# Patient Record
Sex: Female | Born: 2000 | Race: Black or African American | Hispanic: No | Marital: Single | Smoking: Former smoker
Health system: Southern US, Community
[De-identification: ages and names within clinical notes are randomized; demographics above are authoritative.]

## PROBLEM LIST (undated history)

## (undated) DIAGNOSIS — N390 Urinary tract infection, site not specified: Secondary | ICD-10-CM

## (undated) DIAGNOSIS — A749 Chlamydial infection, unspecified: Secondary | ICD-10-CM

## (undated) DIAGNOSIS — A599 Trichomoniasis, unspecified: Secondary | ICD-10-CM

## (undated) DIAGNOSIS — A549 Gonococcal infection, unspecified: Secondary | ICD-10-CM

## (undated) DIAGNOSIS — F32A Depression, unspecified: Secondary | ICD-10-CM

## (undated) DIAGNOSIS — R519 Headache, unspecified: Secondary | ICD-10-CM

## (undated) DIAGNOSIS — F419 Anxiety disorder, unspecified: Secondary | ICD-10-CM

## (undated) HISTORY — PX: HERNIA REPAIR: SHX51

---
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
## 2021-02-10 IMAGING — CT CT HEAD W/O CM
3 series · 14 of 47 positions shown, 16 images · non-contrast
Comparison: None.

CLINICAL DATA: Sudden onset of headaches with right-sided facial
numbness and visual loss in the right eye.

EXAM:
CT HEAD WITHOUT CONTRAST
TECHNIQUE: Contiguous axial images were obtained from the base of the skull
through the vertex without intravenous contrast.

[Series 3: head 5.0 h30s · axial · 0.43mm/px · z∈[-129,-4]mm · 8 of 30 slices shown, 10 images]
[im 3/30  brain]
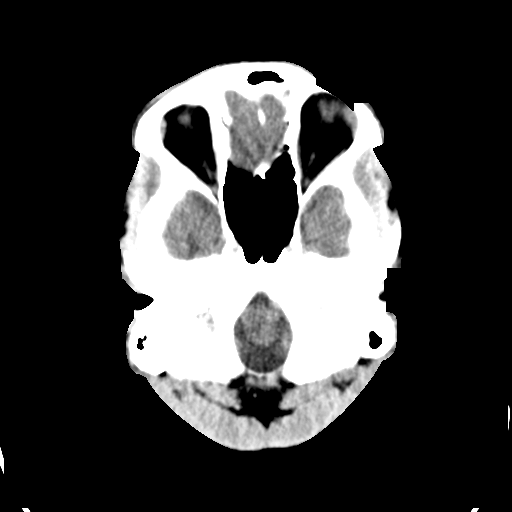
[im 3/30  bone]
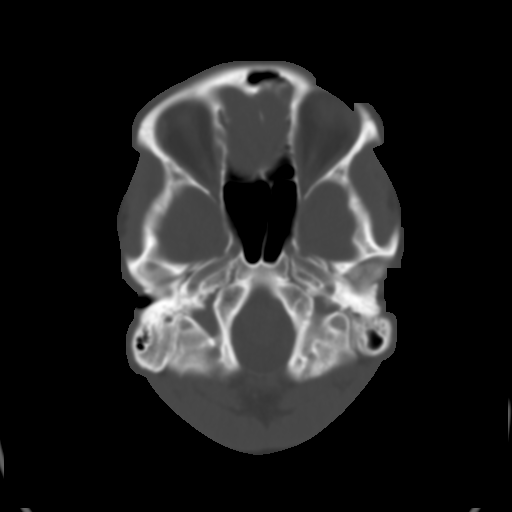
[im 7/30  brain]
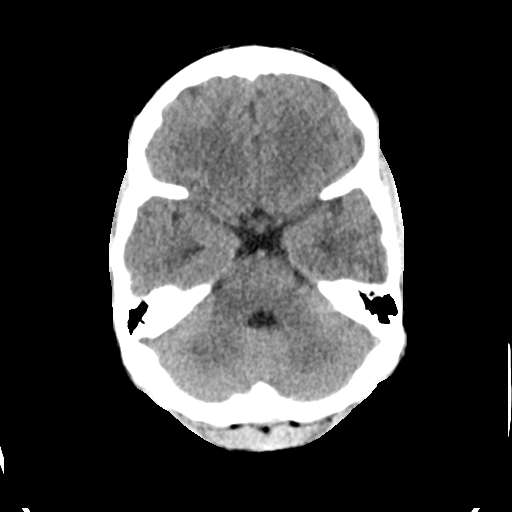
[im 10/30  brain]
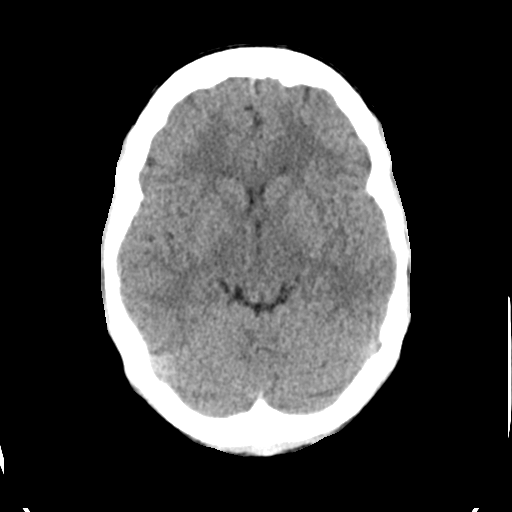
[im 14/30  brain]
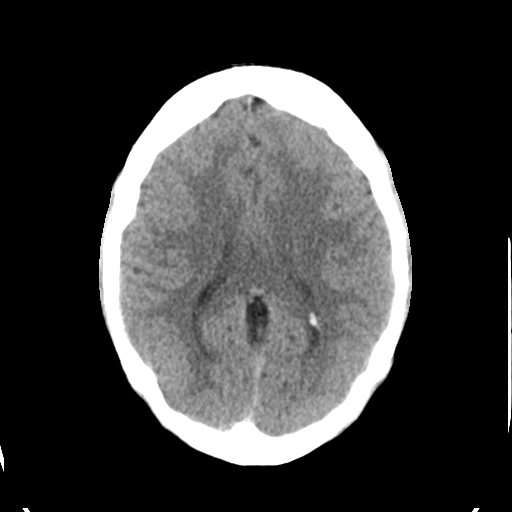
[im 17/30  brain]
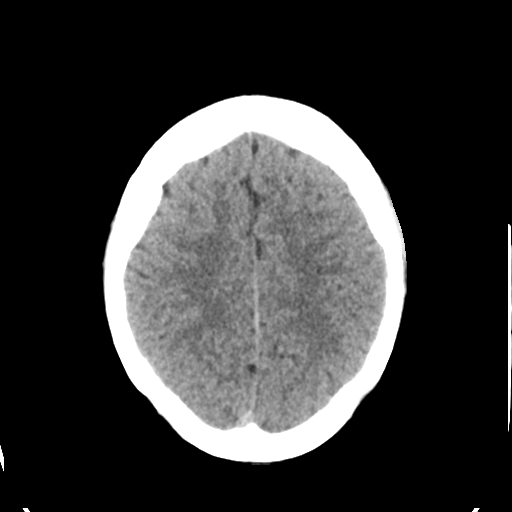
[im 17/30  bone]
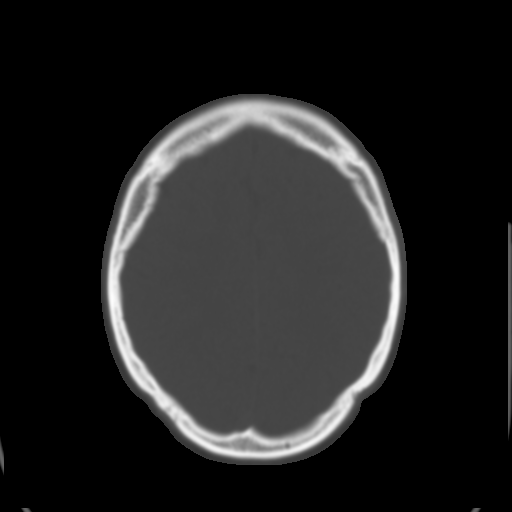
[im 21/30  brain]
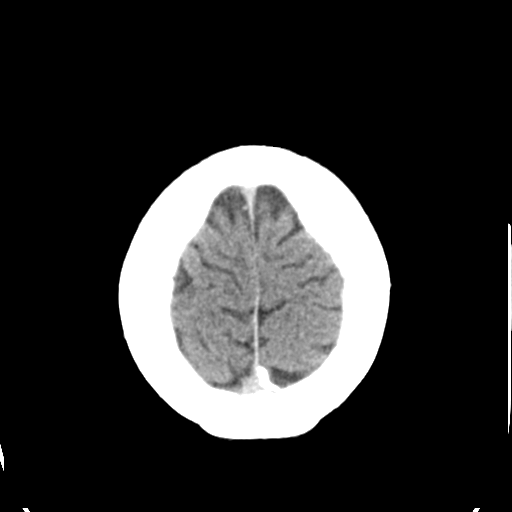
[im 24/30  brain]
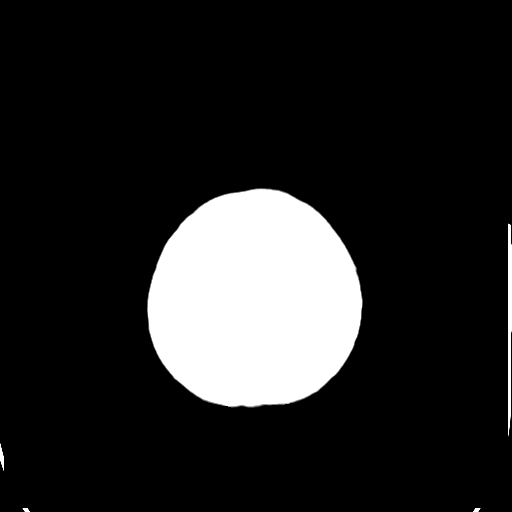
[im 28/30  brain]
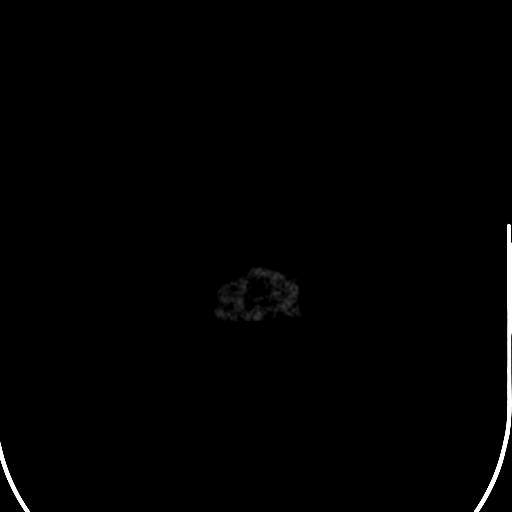

[Series 5: head 3.0 mpr cor · coronal · 0.28mm/px · 3 of 75 slices shown]
[im 25/75  brain]
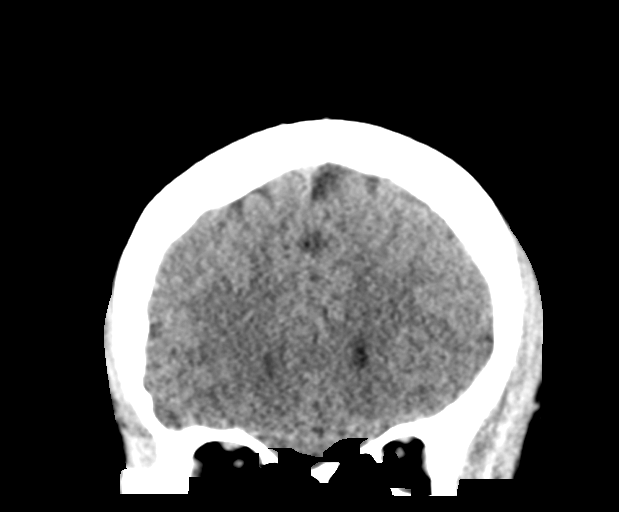
[im 33/75  brain]
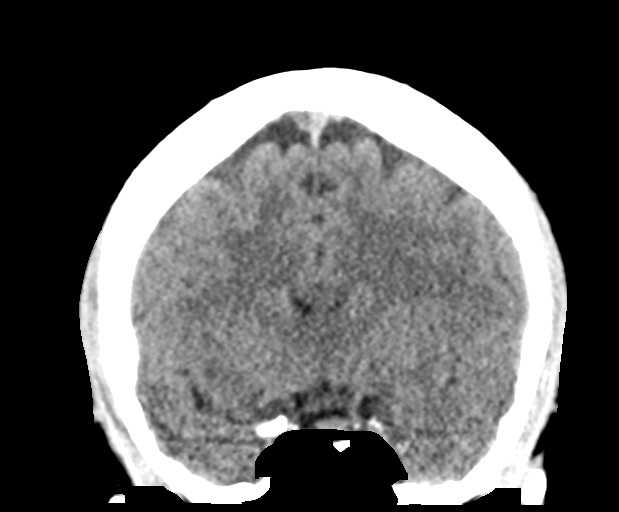
[im 42/75  brain]
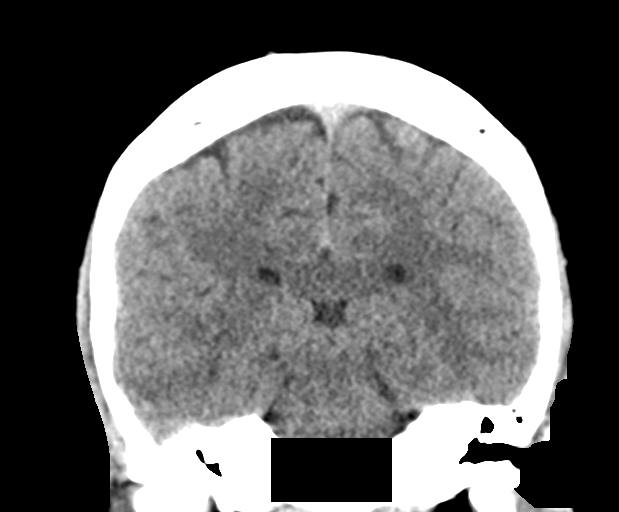

[Series 6: head 3.0 mpr sag · sagittal · 0.29mm/px · 3 of 67 slices shown]
[im 23/67  brain]
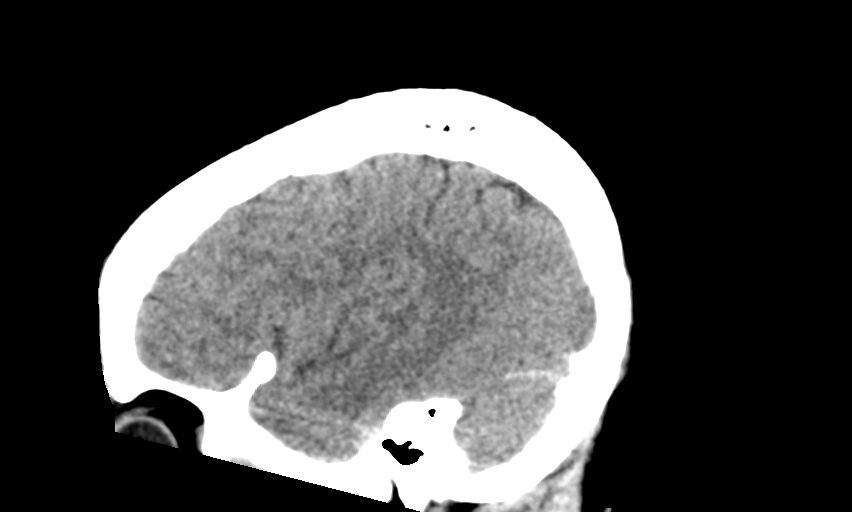
[im 34/67  brain]
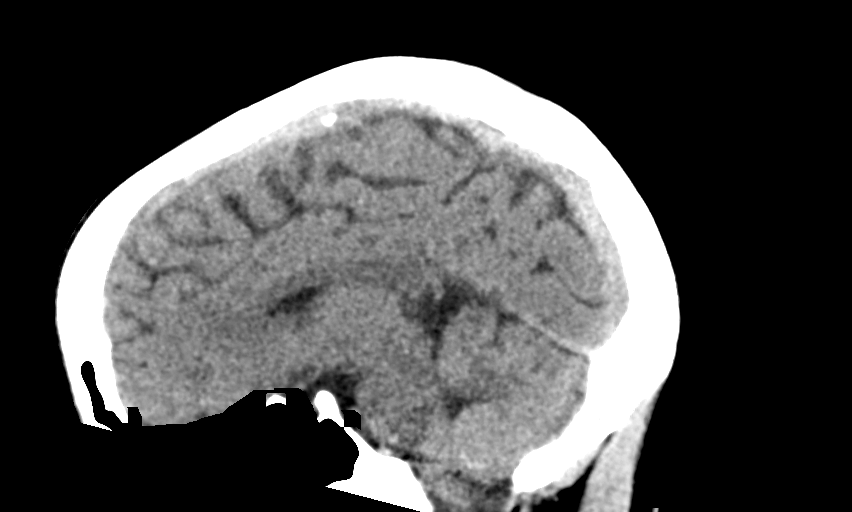
[im 45/67  brain]
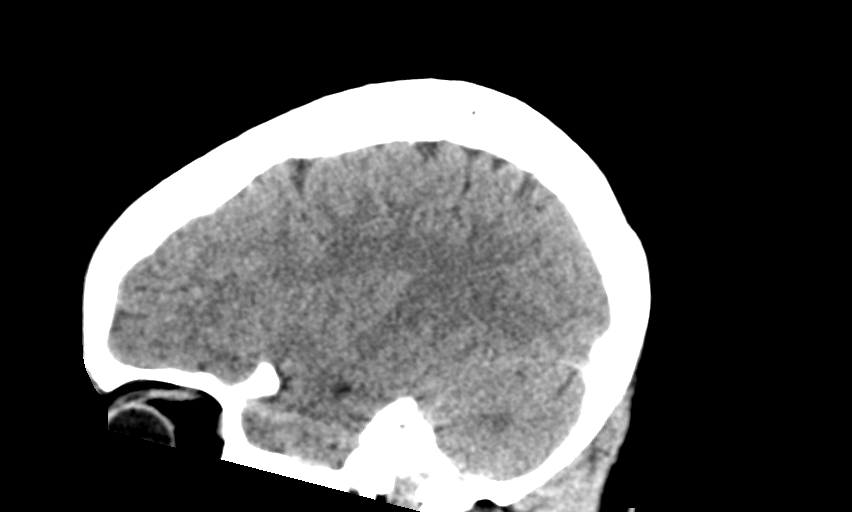

[14 of 47 positions shown; findings below may reference images not displayed]

FINDINGS: Brain: There is no evidence of acute intracranial hemorrhage, mass
lesion, brain edema or extra-axial fluid collection. The ventricles
and subarachnoid spaces are appropriately sized for age. There is no
CT evidence of acute cortical infarction.

Vascular:  No hyperdense vessel identified.

Skull: Negative for fracture or focal lesion.

Sinuses/Orbits: The visualized paranasal sinuses and mastoid air
cells are clear. No orbital abnormalities are seen.

Other: Incomplete posterior arch C1, normal variant.
IMPRESSION: Negative noncontrast head CT.

## 2021-03-19 IMAGING — DX DG ELBOW COMPLETE 3+V*L*
3 series · 3 of 3 positions shown · non-contrast
Comparison: None.

CLINICAL DATA: Status post fall.

EXAM:
LEFT ELBOW - COMPLETE 3+ VIEW

[elbow ap]
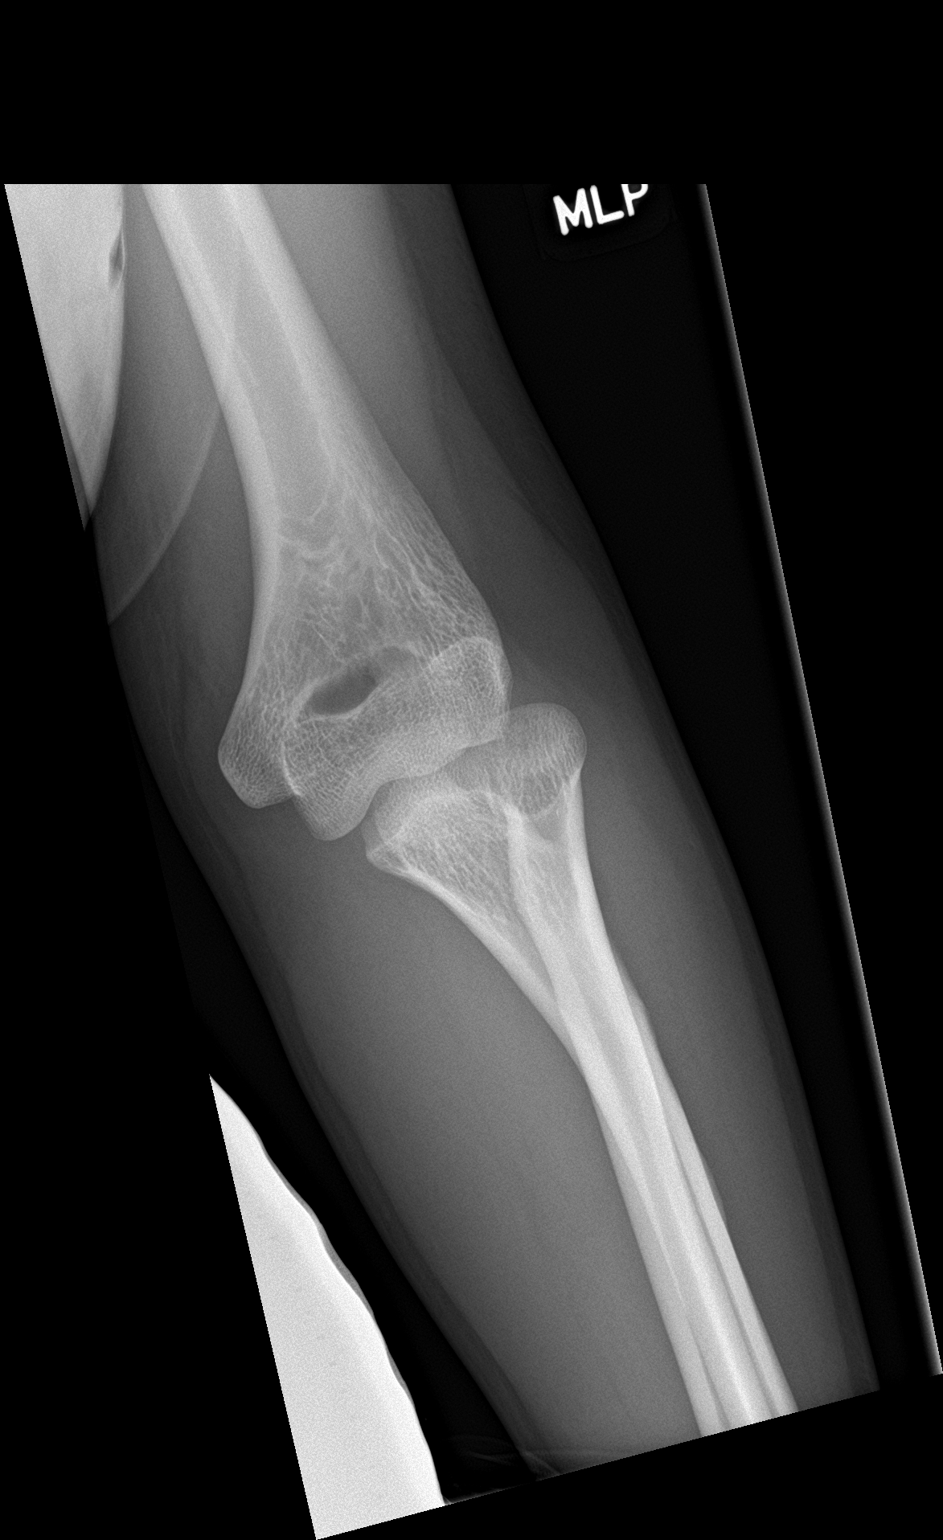

[elbow lat]
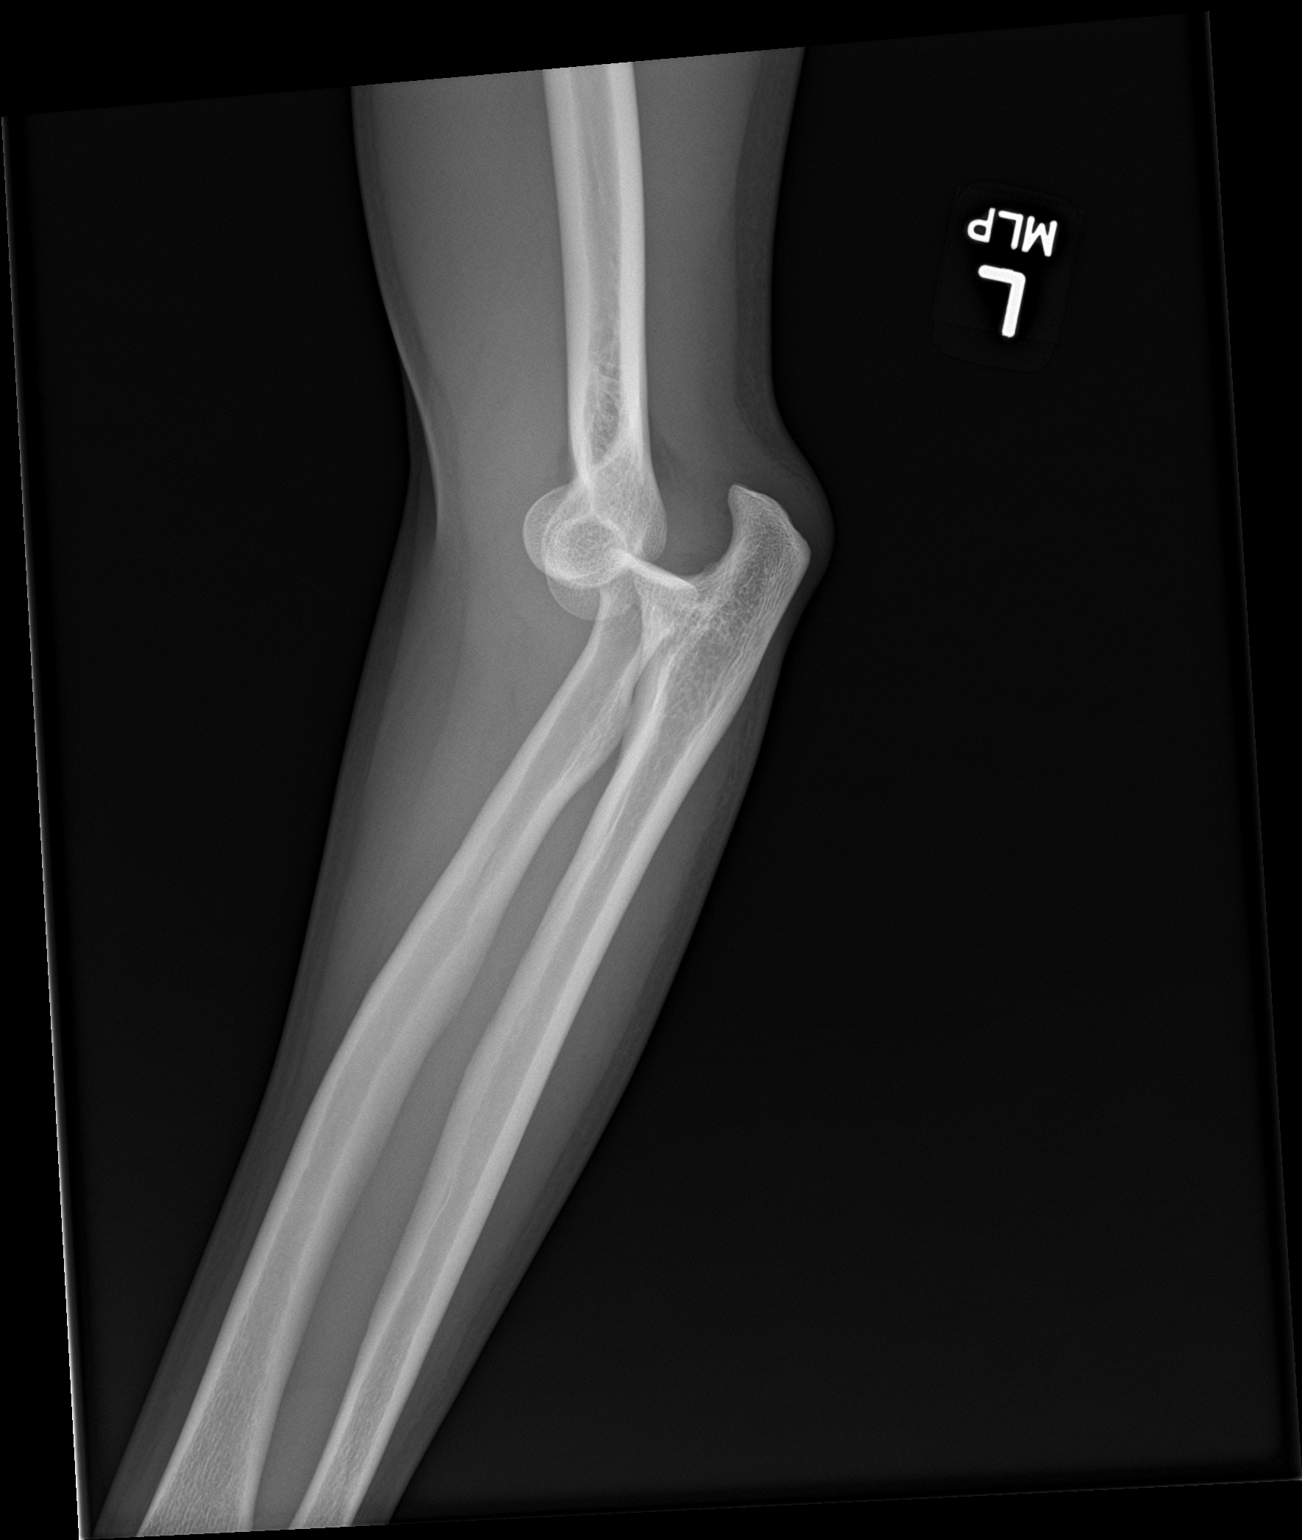

[elbow obl]
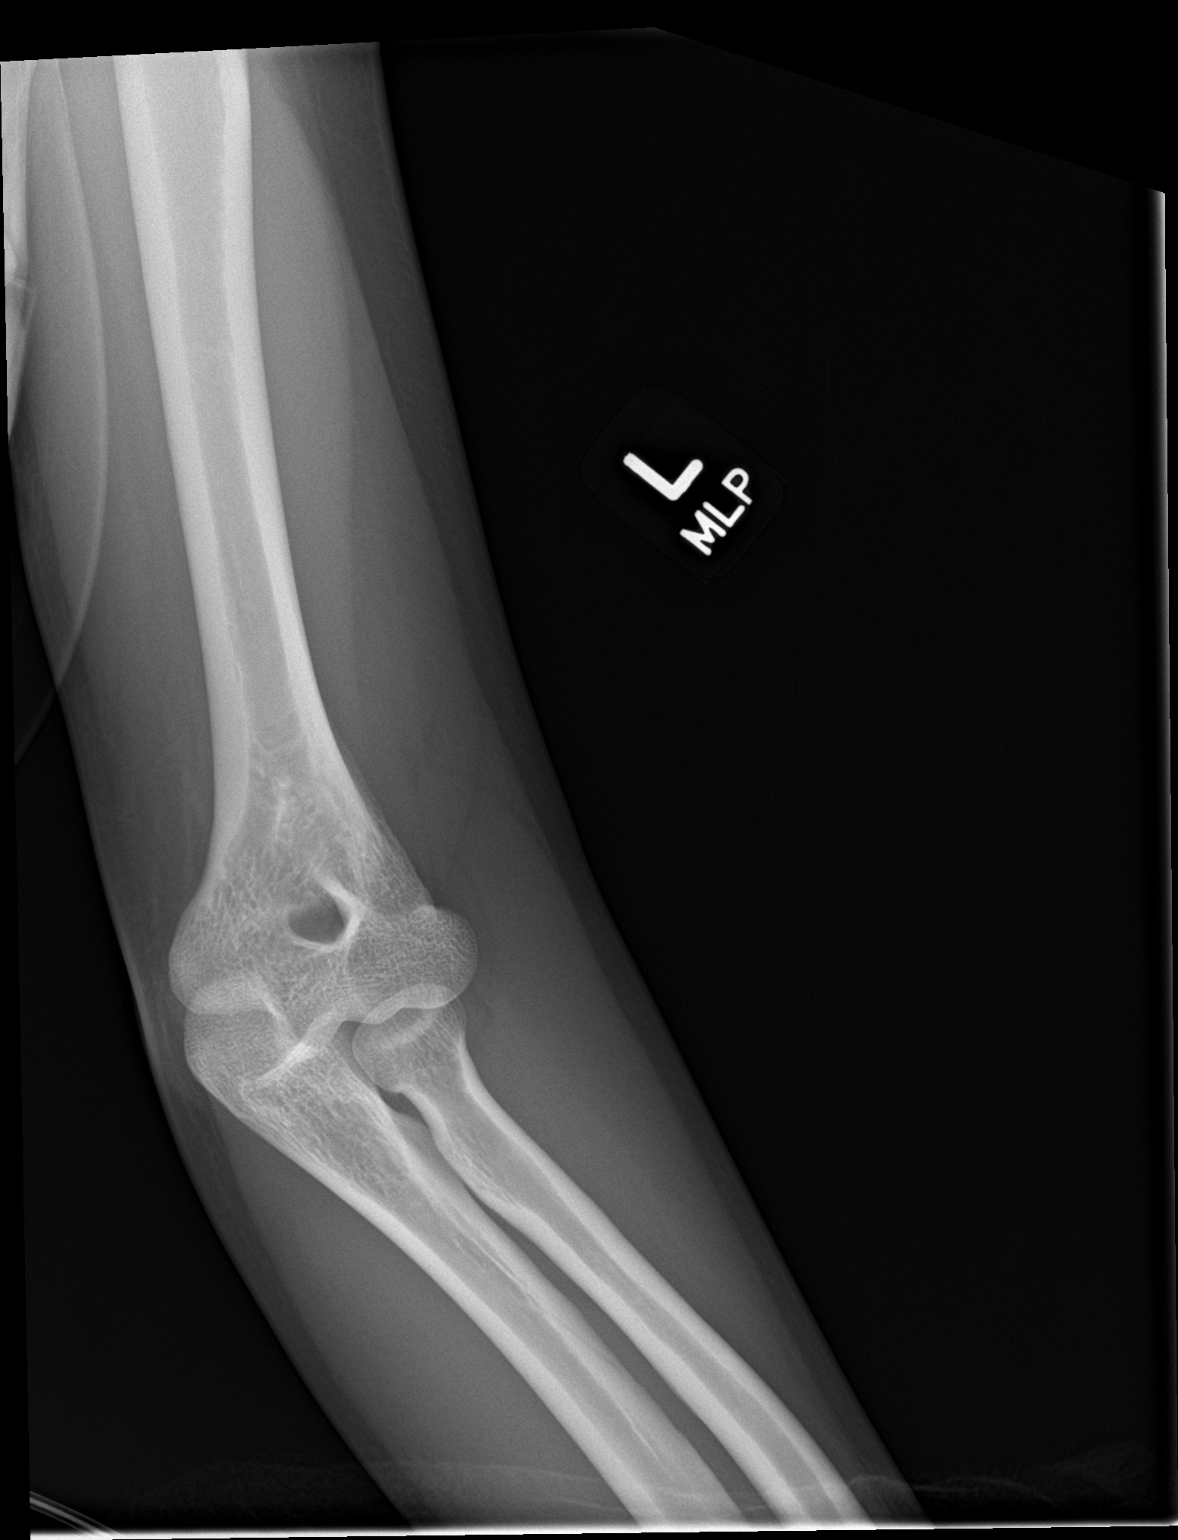

[3 of 3 positions shown; findings below may reference images not displayed]

FINDINGS: There is no evidence of an acute fracture. Posterior dislocation of
the left elbow is noted. There is no evidence of arthropathy or
other focal bone abnormality. Soft tissues are unremarkable.
IMPRESSION: Posterior left elbow dislocation.

## 2021-03-26 LAB — OB RESULTS CONSOLE RPR: RPR: NONREACTIVE

## 2021-03-26 LAB — OB RESULTS CONSOLE HEPATITIS B SURFACE ANTIGEN: Hepatitis B Surface Ag: NEGATIVE

## 2021-03-27 DIAGNOSIS — F1290 Cannabis use, unspecified, uncomplicated: Secondary | ICD-10-CM | POA: Insufficient documentation

## 2021-04-11 LAB — OB RESULTS CONSOLE GC/CHLAMYDIA
Chlamydia: NEGATIVE
Gonorrhea: NEGATIVE

## 2021-04-17 DIAGNOSIS — B9689 Other specified bacterial agents as the cause of diseases classified elsewhere: Secondary | ICD-10-CM | POA: Insufficient documentation

## 2021-04-17 DIAGNOSIS — Z8759 Personal history of other complications of pregnancy, childbirth and the puerperium: Secondary | ICD-10-CM | POA: Insufficient documentation

## 2021-04-17 DIAGNOSIS — L732 Hidradenitis suppurativa: Secondary | ICD-10-CM | POA: Insufficient documentation

## 2021-04-17 DIAGNOSIS — Z8659 Personal history of other mental and behavioral disorders: Secondary | ICD-10-CM | POA: Insufficient documentation

## 2021-04-17 DIAGNOSIS — Z8619 Personal history of other infectious and parasitic diseases: Secondary | ICD-10-CM | POA: Insufficient documentation

## 2021-06-20 DIAGNOSIS — O283 Abnormal ultrasonic finding on antenatal screening of mother: Secondary | ICD-10-CM | POA: Insufficient documentation

## 2021-06-21 DIAGNOSIS — A599 Trichomoniasis, unspecified: Secondary | ICD-10-CM | POA: Insufficient documentation

## 2021-07-25 ENCOUNTER — Telehealth: Payer: Self-pay | Admitting: Family Medicine

## 2021-07-25 ENCOUNTER — Telehealth: Payer: Self-pay | Admitting: General Practice

## 2021-07-25 NOTE — Telephone Encounter (Signed)
Patient called and left message on nurse voicemail line stating she is currently [redacted]w[redacted]d pregnant and her last doctor's visit was 3 weeks ago. Patient states she recently moved back to the area and wants to set up a New Ob appt to transfer her care to [place].

## 2021-07-25 NOTE — Telephone Encounter (Signed)
Patient was called at number listed under mobile number and there was no answer to the call. A voicemail was left with the office phone number and details to call back.

## 2021-07-31 ENCOUNTER — Other Ambulatory Visit: Payer: Self-pay

## 2021-07-31 ENCOUNTER — Encounter (HOSPITAL_COMMUNITY): Payer: Self-pay | Admitting: Emergency Medicine

## 2021-07-31 ENCOUNTER — Inpatient Hospital Stay (HOSPITAL_COMMUNITY)
Admission: AD | Admit: 2021-07-31 | Discharge: 2021-07-31 | Disposition: A | Discharge status: Home / Self Care | Payer: Medicaid Other | Attending: Obstetrics and Gynecology | Admitting: Obstetrics and Gynecology

## 2021-07-31 DIAGNOSIS — J101 Influenza due to other identified influenza virus with other respiratory manifestations: Secondary | ICD-10-CM | POA: Insufficient documentation

## 2021-07-31 DIAGNOSIS — Z20822 Contact with and (suspected) exposure to covid-19: Secondary | ICD-10-CM | POA: Insufficient documentation

## 2021-07-31 DIAGNOSIS — Z3A27 27 weeks gestation of pregnancy: Secondary | ICD-10-CM | POA: Insufficient documentation

## 2021-07-31 DIAGNOSIS — O99512 Diseases of the respiratory system complicating pregnancy, second trimester: Secondary | ICD-10-CM | POA: Insufficient documentation

## 2021-07-31 DIAGNOSIS — B349 Viral infection, unspecified: Secondary | ICD-10-CM | POA: Diagnosis present

## 2021-07-31 HISTORY — DX: Gonococcal infection, unspecified: A54.9

## 2021-07-31 HISTORY — DX: Urinary tract infection, site not specified: N39.0

## 2021-07-31 HISTORY — DX: Trichomoniasis, unspecified: A59.9

## 2021-07-31 HISTORY — DX: Headache, unspecified: R51.9

## 2021-07-31 HISTORY — DX: Chlamydial infection, unspecified: A74.9

## 2021-07-31 HISTORY — DX: Depression, unspecified: F32.A

## 2021-07-31 HISTORY — DX: Anxiety disorder, unspecified: F41.9

## 2021-07-31 LAB — URINALYSIS, ROUTINE W REFLEX MICROSCOPIC
Bilirubin Urine: NEGATIVE
Glucose, UA: NEGATIVE mg/dL
Hgb urine dipstick: NEGATIVE
Ketones, ur: 80 mg/dL — AB
Nitrite: NEGATIVE
Protein, ur: 100 mg/dL — AB
Specific Gravity, Urine: 1.03 (ref 1.005–1.030)
pH: 6 (ref 5.0–8.0)

## 2021-07-31 LAB — RESP PANEL BY RT-PCR (FLU A&B, COVID) ARPGX2
Influenza A by PCR: POSITIVE — AB
Influenza B by PCR: NEGATIVE
SARS Coronavirus 2 by RT PCR: NEGATIVE

## 2021-07-31 MED ORDER — CYCLOBENZAPRINE HCL 5 MG PO TABS
10.0000 mg | ORAL_TABLET | Freq: Once | ORAL | Status: AC
  Administered 2021-07-31: 10 mg via ORAL
  Filled 2021-07-31: qty 2

## 2021-07-31 MED ORDER — ACETAMINOPHEN 500 MG PO TABS
1000.0000 mg | ORAL_TABLET | Freq: Once | ORAL | Status: AC
  Administered 2021-07-31: 1000 mg via ORAL
  Filled 2021-07-31: qty 2

## 2021-07-31 MED ORDER — OSELTAMIVIR PHOSPHATE 75 MG PO CAPS: 75.0000 mg | ORAL_CAPSULE | Freq: Two times a day (BID) | ORAL | 0 refills | Status: AC

## 2021-07-31 MED ORDER — CYCLOBENZAPRINE HCL 5 MG PO TABS: 5.0000 mg | ORAL_TABLET | Freq: Three times a day (TID) | ORAL | 0 refills | Status: DC | PRN

## 2021-07-31 NOTE — ED Provider Notes (Signed)
Emergency Medicine Provider OB Triage Evaluation Note  Kerry Lucas is a 20 y.o. female, G2P1, at [redacted]w[redacted]d gestation who presents to the emergency department with complaints of headache onset today.  She has sick contacts of her daughter at home.  Patient was last seen by her OB in [place] in October 2022.  Since moving to the area she does not have OB follow-up yet.  Patient has associated fever, low back pain.  Patient has not tried medications for her symptoms.  Patient denies chest pain, shortness of breath, abdominal pain, increasing vaginal fluids, vaginal bleeding, rhinorrhea, nasal congestion.  Review of  Systems  Positive: Headache, fever Negative: Chest pain, abdominal pain  Physical Exam  BP (!) 117/106 (BP Location: Right Arm)   Pulse (!) 118   Temp (!) 100.7 F (38.2 C) (Oral)   Resp 13   Ht 5\' 1"  (1.549 m)   LMP 11/20/2020   SpO2 98%  General: Awake, no distress  HEENT: Atraumatic  Resp: Normal effort  Cardiac: Normal rate Abd: Nondistended, nontender  MSK: Moves all extremities without difficulty Neuro: Speech clear  Medical Decision Making  Pt evaluated for pregnancy concern and is stable for transfer to MAU. Pt is in agreement with plan for transfer.  10:44 AM Discussed with MAU APP, who accepts patient in transfer.  Will obtain COVID, flu swab prior to transfer, with MAU APP to follow-up results.  Clinical Impression   1. Viral illness        Leauna Sharber A, PA-C 07/31/21 1047    08/02/21, DO 07/31/21 1624

## 2021-07-31 NOTE — ED Notes (Signed)
Transporter called to take patient to MAU.

## 2021-07-31 NOTE — ED Triage Notes (Signed)
Patient c/o headache, eye pressure, fever and lower back pain. Patient is 27 weeks and 3 days pregnant- reports tightness in abdomen, similar to contractions. EDD 10/27/21 currently does not have an OBGYN.

## 2021-07-31 NOTE — Discharge Instructions (Signed)

## 2021-07-31 NOTE — MAU Note (Signed)
Pain in back started yesterday afternoon, became constant and excruciating last night. Cough started on Monday, has gotten progressively worse, coughing up clear mucous.

## 2021-07-31 NOTE — MAU Provider Note (Signed)
History     CSN: [id number]  Arrival date and time: 07/31/21 1013   Event Date/Time   First Provider Initiated Contact with Patient 07/31/21 1226      Chief Complaint  Patient presents with   Fever   Headache   HPI Kerry Lucas is a 20 y.o. G2P1001 at [redacted]w[redacted]d who presents with back pain, fever, headache, and cough. Symptoms started yesterday morning. Reports non productive cough. Temp has been up to 100.7 at home. Reports pain throughout her lower back that is worse with movement. Rates pain 10/10. Hasn't taken anything for her symptoms.  Denies sore throat, abdominal pain, dysuria, vaginal bleeding, or LOF.   OB History     Gravida  2   Para  1   Term  1   Preterm      AB      Living  1      SAB      IAB      Ectopic      Multiple      Live Births  1           Past Medical History:  Diagnosis Date   Anxiety    Chlamydia    Depression    Gonorrhea    Headache    Trichomonas infection    UTI (urinary tract infection)     Past Surgical History:  Procedure Laterality Date   HERNIA REPAIR Right 2017    Family History  Problem Relation Age of Onset   Healthy Mother    Hypertension Father    Diabetes Father     Social History   Tobacco Use   Smoking status: Former    Types: Cigarettes   Smokeless tobacco: Never   Tobacco comments:    Stopped cigarettes in June 2022  Vaping Use   Vaping Use: Former  Substance Use Topics   Alcohol use: Never   Drug use: Not Currently    Types: Marijuana    Allergies: No Known Allergies  No medications prior to admission.    Review of Systems  Constitutional: Negative.   HENT:  Negative for ear pain and sore throat.   Respiratory:  Positive for cough. Negative for shortness of breath and wheezing.   Cardiovascular:  Negative for chest pain.  Gastrointestinal: Negative.   Genitourinary: Negative.   Musculoskeletal:  Positive for back pain and myalgias.  Neurological:  Positive for  headaches.  Physical Exam   Blood pressure (!) 113/56, pulse (!) 113, temperature 98.3 F (36.8 C), temperature source Axillary, resp. rate 17, height 5\' 1"  (1.549 m), last menstrual period 11/20/2020, SpO2 100 %.  Physical Exam Vitals and nursing note reviewed.  Constitutional:      Appearance: She is ill-appearing.  HENT:     Head: Normocephalic and atraumatic.  Eyes:     General: No scleral icterus.    Pupils: Pupils are equal, round, and reactive to light.  Cardiovascular:     Rate and Rhythm: Regular rhythm. Tachycardia present.     Heart sounds: Normal heart sounds.  Pulmonary:     Effort: Pulmonary effort is normal. No respiratory distress.     Breath sounds: Normal breath sounds. No wheezing.  Skin:    General: Skin is warm and dry.  Neurological:     Mental Status: She is alert.  Psychiatric:        Mood and Affect: Mood normal.        Behavior: Behavior normal.    MAU  Course  Procedures Results for orders placed or performed during the hospital encounter of 07/31/21 (from the past 24 hour(s))  Resp Panel by RT-PCR (Flu A&B, Covid) Nasopharyngeal Swab     Status: Abnormal   Collection Time: 07/31/21 10:39 AM   Specimen: Nasopharyngeal Swab; Nasopharyngeal(NP) swabs in vial transport medium  Result Value Ref Range   SARS Coronavirus 2 by RT PCR NEGATIVE NEGATIVE   Influenza A by PCR POSITIVE (A) NEGATIVE   Influenza B by PCR NEGATIVE NEGATIVE  Urinalysis, Routine w reflex microscopic Urine, Clean Catch     Status: Abnormal   Collection Time: 07/31/21  1:09 PM  Result Value Ref Range   Color, Urine AMBER (A) YELLOW   APPearance CLOUDY (A) CLEAR   Specific Gravity, Urine 1.030 1.005 - 1.030   pH 6.0 5.0 - 8.0   Glucose, UA NEGATIVE NEGATIVE mg/dL   Hgb urine dipstick NEGATIVE NEGATIVE   Bilirubin Urine NEGATIVE NEGATIVE   Ketones, ur 80 (A) NEGATIVE mg/dL   Protein, ur 419 (A) NEGATIVE mg/dL   Nitrite NEGATIVE NEGATIVE   Leukocytes,Ua LARGE (A) NEGATIVE    RBC / HPF 0-5 0 - 5 RBC/hpf   WBC, UA 21-50 0 - 5 WBC/hpf   Bacteria, UA MANY (A) NONE SEEN   Squamous Epithelial / LPF 21-50 0 - 5   Mucus PRESENT     MDM Patient has tested positive for flu. Her symptoms & VS improved with meds in MAU. Will rx tamiflu.   Assessment and Plan   1. Influenza A   2. [redacted] weeks gestation of pregnancy    -Rx tamiflu -increase fluid intake -tylenol prn & given list of OTC meds safe in pregnancy -reviewed reasons to return to MAU  Judeth Horn 07/31/2021, 3:35 PM

## 2021-07-31 NOTE — MAU Note (Signed)
Sent from [facility]. Symptoms started yesterday. C/o back pain, cramping. Cough and headache and temp 100.7. Feeling baby move but not as mucha s normal. Denies any vag bleeding or discharge. Just moved from [facility]. LOoking for OB.

## 2021-08-19 ENCOUNTER — Encounter: Payer: Medicaid Other | Admitting: Obstetrics and Gynecology

## 2021-08-26 LAB — OB RESULTS CONSOLE HGB/HCT, BLOOD
HCT: 37 (ref 29–41)
Hemoglobin: 11.8

## 2021-08-26 LAB — OB RESULTS CONSOLE ABO/RH: RH Type: POSITIVE

## 2021-08-26 LAB — OB RESULTS CONSOLE RUBELLA ANTIBODY, IGM: Rubella: IMMUNE

## 2021-08-26 LAB — HEPATITIS B SURFACE ANTIGEN: Hepatitis B Surface Antigen: NONREACTIVE

## 2021-08-26 LAB — OB RESULTS CONSOLE GC/CHLAMYDIA
Chlamydia: NEGATIVE
Gonorrhea: NEGATIVE

## 2021-08-26 LAB — OB RESULTS CONSOLE RPR: RPR: NONREACTIVE

## 2021-08-26 LAB — GLUCOSE TOLERANCE, 1 HOUR: Glucose, 1 hour: 115

## 2021-08-26 LAB — OB RESULTS CONSOLE HIV ANTIBODY (ROUTINE TESTING): HIV: NONREACTIVE

## 2021-08-26 LAB — OB RESULTS CONSOLE ANTIBODY SCREEN: Antibody Screen: NEGATIVE

## 2021-08-26 LAB — OB RESULTS CONSOLE PLATELET COUNT: Platelets: 206

## 2021-09-08 NOTE — L&D Delivery Note (Signed)
OB/GYN Faculty Practice Delivery Note  Kerry Lucas is a 21 y.o. G2P1001 s/p SVD at [redacted]w[redacted]d. She was admitted for SROM.   ROM: 11h 69m with clear fluid GBS Status:  Negative/-- (01/24 1542) Maximum Maternal Temperature: 97.28F  Labor Progress: Initial SVE: 6/90/-1. She then progressed to complete with expectant management and AROM of forebag.   Delivery Date/Time: 1/31 at 2109 Delivery: Called to room when patient was complete. Patient pushed once in hands/knees position with subsequent delivery of infant. Head delivered midline OA. No nuchal cord present. Shoulder and body delivered in usual fashion. Infant with good tone/cry, placed on mother's abdomen, dried and stimulated. Cord clamped x 2 after 1-minute delay, and cut by FOB. Cord blood drawn. Placenta delivered spontaneously with gentle cord traction. Fundus firm with massage and Pitocin. Labia, perineum, vagina, and cervix inspected with no lacerations.   While in mom's arms at approximate 1.5 minutes of life, noted new fetal poor tone and respiratory effort. Infant was then taken to the warmer and evaluated by RN staff/NICU team. Resuscitated and then remained skin to skin with mother.   Baby Weight: pending  Placenta: 3 vessel, intact. Sent to L&D Complications: None Lacerations: None EBL: 350 mL Analgesia: None  Infant:  APGAR (1 MIN): 6   APGAR (5 MINS): 9   Leticia Penna, DO  OB Family Medicine Fellow, [facility] for [facility], [facility] 10/08/2021, 8:49 PM

## 2021-09-25 ENCOUNTER — Other Ambulatory Visit: Payer: Self-pay | Admitting: Family

## 2021-09-25 DIAGNOSIS — Z3A36 36 weeks gestation of pregnancy: Secondary | ICD-10-CM

## 2021-09-25 DIAGNOSIS — Z363 Encounter for antenatal screening for malformations: Secondary | ICD-10-CM

## 2021-09-25 DIAGNOSIS — D563 Thalassemia minor: Secondary | ICD-10-CM

## 2021-09-30 ENCOUNTER — Encounter (HOSPITAL_COMMUNITY): Payer: Self-pay | Admitting: Family Medicine

## 2021-09-30 ENCOUNTER — Encounter: Payer: Self-pay | Admitting: *Deleted

## 2021-09-30 ENCOUNTER — Inpatient Hospital Stay (HOSPITAL_COMMUNITY)
Admission: AD | Admit: 2021-09-30 | Discharge: 2021-09-30 | Disposition: A | Discharge status: Home / Self Care | Payer: Medicaid Other | Attending: Family Medicine | Admitting: Family Medicine

## 2021-09-30 ENCOUNTER — Other Ambulatory Visit: Payer: Self-pay

## 2021-09-30 DIAGNOSIS — Z3A36 36 weeks gestation of pregnancy: Secondary | ICD-10-CM | POA: Diagnosis not present

## 2021-09-30 DIAGNOSIS — O4703 False labor before 37 completed weeks of gestation, third trimester: Secondary | ICD-10-CM | POA: Diagnosis present

## 2021-09-30 DIAGNOSIS — O471 False labor at or after 37 completed weeks of gestation: Secondary | ICD-10-CM

## 2021-09-30 LAB — POCT FERN TEST: POCT Fern Test: NEGATIVE

## 2021-09-30 NOTE — MAU Provider Note (Signed)
S: [name] is a 21 y.o. G2P1001 at [redacted]w[redacted]d  who presents to MAU today for labor evaluation.     Cervical exam by RN:  Dilation: 1 Effacement (%): 80 Cervical Position: Posterior Station: 0 Exam by:: jolynn  Fetal Monitoring: Baseline: 140 Variability: moderate Accelerations: ++ Decelerations: none Contractions: initially q3, now spacing out with less pain  MDM Discussed patient with RN. NST reviewed.   A: SIUP at [redacted]w[redacted]d  False labor  P: Discharge home Labor precautions and kick counts included in AVS Patient to follow-up with OB provider as scheduled  Patient may return to MAU as needed or when in labor   Levie Heritage, DO 09/30/2021 12:22 PM

## 2021-09-30 NOTE — MAU Note (Addendum)
Had intercourse with fiance this morning.  Ever since then has been in excruciating pain.  Feels like contractions. one small gush after- none since. Noted some blood in toilet after voiding. Feeling lots of pressure in rectum

## 2021-10-01 ENCOUNTER — Other Ambulatory Visit (HOSPITAL_COMMUNITY)
Admission: RE | Admit: 2021-10-01 | Discharge: 2021-10-01 | Disposition: A | Discharge status: Home / Self Care | Payer: Medicaid Other | Source: Ambulatory Visit | Attending: Family Medicine | Admitting: Family Medicine

## 2021-10-01 ENCOUNTER — Ambulatory Visit: Payer: Medicaid Other

## 2021-10-01 ENCOUNTER — Encounter: Payer: Self-pay | Admitting: Family Medicine

## 2021-10-01 ENCOUNTER — Ambulatory Visit (INDEPENDENT_AMBULATORY_CARE_PROVIDER_SITE_OTHER): Payer: Medicaid Other | Admitting: Family Medicine

## 2021-10-01 VITALS — BP 112/71 | HR 93 | Wt 136.6 lb

## 2021-10-01 DIAGNOSIS — Z3480 Encounter for supervision of other normal pregnancy, unspecified trimester: Secondary | ICD-10-CM | POA: Diagnosis not present

## 2021-10-01 DIAGNOSIS — Z3493 Encounter for supervision of normal pregnancy, unspecified, third trimester: Secondary | ICD-10-CM | POA: Insufficient documentation

## 2021-10-01 DIAGNOSIS — Z3A36 36 weeks gestation of pregnancy: Secondary | ICD-10-CM

## 2021-10-01 NOTE — Progress Notes (Signed)
Subjective:   Kerry Lucas is a 21 y.o. G2P1001 at [redacted]w[redacted]d by early ultrasound being seen today for her first obstetrical visit.  She was previously seen at [place] and then transferred [facility]. Transferred from [place] after moving to [facility] from [place]. Transferring care to [facility] because of previous pregnancy being high risk (teen pregnancy at age 72) and because of low EFW of last baby.Her obstetrical history is significant for  1 prior pregnancy at 21 years old . Patient does intend to breast feed. Pregnancy history fully reviewed.  Patient reports no complaints.  HISTORY: OB History  Gravida Para Term Preterm AB Living  2 1 1  0 0 1  SAB IAB Ectopic Multiple Live Births  0 0 0 0 1    # Outcome Date GA Lbr Len/2nd Weight Sex Delivery Anes PTL Lv  2 Current           1 Term            Past Medical History:  Diagnosis Date   Anxiety    Chlamydia    Depression    denies issues   Gonorrhea    Headache    Trichomonas infection    UTI (urinary tract infection)    Past Surgical History:  Procedure Laterality Date   HERNIA REPAIR Right 2017   Family History  Problem Relation Age of Onset   Healthy Mother    Hypertension Father    Diabetes Father    Social History   Tobacco Use   Smoking status: Former    Types: Cigarettes   Smokeless tobacco: Never   Tobacco comments:    Stopped cigarettes in June 2022  Vaping Use   Vaping Use: Never used  Substance Use Topics   Alcohol use: Never   Drug use: Not Currently    Types: Marijuana   No Known Allergies Current Outpatient Medications on File Prior to Visit  Medication Sig Dispense Refill   cyclobenzaprine (FLEXERIL) 5 MG tablet Take 1 tablet (5 mg total) by mouth 3 (three) times daily as needed for muscle spasms. 20 tablet 0   ondansetron (ZOFRAN ODT) 4 MG disintegrating tablet Take 1 tablet (4 mg total) by mouth every 8 (eight) hours as needed for nausea or vomiting. 5 tablet 0   Prenatal Vit-Fe Fumarate-FA (PRENATAL  VITAMIN PO) Take by mouth.     No current facility-administered medications on file prior to visit.     Exam   Vitals:   10/01/21 1454  BP: 112/71  Pulse: 93  Weight: 136 lb 9.6 oz (62 kg)   Fetal Heart Rate (bpm): 141  Uterus:     Pelvic Exam: Perineum: no hemorrhoids, normal perineum   Vulva: normal external genitalia, no lesions   Vagina:  normal mucosa, normal discharge   Cervix: Exam deferred as examined at MAU yesterday (for labor check)  System: General: well-developed, well-nourished female in no acute distress   Extremities: normal strength, tone, and muscle mass, ROM of all joints is normal   Cardiovascular: regular rate   Respiratory:  no respiratory distress   Abdomen: soft, non-tender; bowel sounds normal; no masses,  no organomegaly, Gravid     Assessment:   Pregnancy: G2P1001 Patient Active Problem List   Diagnosis Date Noted   Fetal echogenic intracardiac focus on prenatal ultrasound 06/20/2021   History of anxiety 04/17/2021   History of vacuum extraction assisted delivery 04/17/2021   Acute cystitis without hematuria 06/14/2020   Abdominal pain, chronic, right lower  quadrant 06/11/2020   Routine screening for STI (sexually transmitted infection) 06/11/2020   Vaginal discharge 06/11/2020   Dysuria 06/11/2020     Plan:  1. Supervision of other normal pregnancy, antepartum 2. [redacted] weeks gestation of pregnancy Transferring care from [facility]. Overall normal pregnancy. Has growth [place] scheduled next week (had today but had to reschedule. Would like to discuss waterbirth. We discussed that she needs to take virtual class and sign consent after and with late transfer to care unsure if she will be able to. However will schedule next appt with CNM to discuss further. - 36 wk Swabs done today - follow up in 1 week - desires waterbirth - next appt with CNM  3. Echogenic intracardiac focus Seen on anatomy US at [place] (see on [place]). LR NIPS. Repeat US at  [facility] 12/19 with EFW 45%ile per provider note. Actual [place] report not seen in chart. - Follow up anatomy scheduled on 10/16/2021  3. Contraception Considering IUD or Depo.  Discussed PP IUD. Patient is considering.    Continue prenatal vitamins. Genetic Screening was done at previous OB office. LR NIPS[employer] SMA risk, and Alpha thal silent carrier Ultrasound discussed; fetal anatomic survey: results reviewed. Problem list reviewed and updated. The nature of [place] with multiple MDs and other Advanced Practice Providers was explained to patient; also emphasized that residents, students are part of our team. Routine obstetric precautions reviewed. Return in about 1 week (around 10/08/2021) for LROB, any provider.  Renard Matter, MD, MPH OB Fellow, Faculty Practice

## 2021-10-02 LAB — CERVICOVAGINAL ANCILLARY ONLY
Chlamydia: NEGATIVE
Comment: NEGATIVE
Comment: NORMAL
Neisseria Gonorrhea: NEGATIVE

## 2021-10-05 LAB — CULTURE, BETA STREP (GROUP B ONLY): Strep Gp B Culture: NEGATIVE

## 2021-10-06 ENCOUNTER — Encounter: Payer: Self-pay | Admitting: Family Medicine

## 2021-10-06 DIAGNOSIS — Z3480 Encounter for supervision of other normal pregnancy, unspecified trimester: Secondary | ICD-10-CM | POA: Insufficient documentation

## 2021-10-08 ENCOUNTER — Inpatient Hospital Stay (HOSPITAL_COMMUNITY)
Admission: AD | Admit: 2021-10-08 | Discharge: 2021-10-10 | DRG: 807 | Disposition: A | Discharge status: Home / Self Care | Payer: Medicaid Other | Attending: Obstetrics & Gynecology | Admitting: Obstetrics & Gynecology

## 2021-10-08 ENCOUNTER — Telehealth: Payer: Self-pay

## 2021-10-08 ENCOUNTER — Other Ambulatory Visit: Payer: Self-pay

## 2021-10-08 ENCOUNTER — Encounter (HOSPITAL_COMMUNITY): Payer: Self-pay | Admitting: Obstetrics and Gynecology

## 2021-10-08 ENCOUNTER — Telehealth: Payer: Self-pay | Admitting: Advanced Practice Midwife

## 2021-10-08 DIAGNOSIS — Z23 Encounter for immunization: Secondary | ICD-10-CM | POA: Diagnosis not present

## 2021-10-08 DIAGNOSIS — Z20822 Contact with and (suspected) exposure to covid-19: Secondary | ICD-10-CM | POA: Diagnosis present

## 2021-10-08 DIAGNOSIS — Z3A37 37 weeks gestation of pregnancy: Secondary | ICD-10-CM | POA: Diagnosis not present

## 2021-10-08 DIAGNOSIS — O4290 Premature rupture of membranes, unspecified as to length of time between rupture and onset of labor, unspecified weeks of gestation: Principal | ICD-10-CM | POA: Diagnosis present

## 2021-10-08 DIAGNOSIS — Z87891 Personal history of nicotine dependence: Secondary | ICD-10-CM | POA: Diagnosis not present

## 2021-10-08 DIAGNOSIS — O26893 Other specified pregnancy related conditions, third trimester: Secondary | ICD-10-CM | POA: Diagnosis present

## 2021-10-08 DIAGNOSIS — O283 Abnormal ultrasonic finding on antenatal screening of mother: Secondary | ICD-10-CM | POA: Diagnosis present

## 2021-10-08 DIAGNOSIS — Z3480 Encounter for supervision of other normal pregnancy, unspecified trimester: Secondary | ICD-10-CM

## 2021-10-08 DIAGNOSIS — O4292 Full-term premature rupture of membranes, unspecified as to length of time between rupture and onset of labor: Secondary | ICD-10-CM | POA: Diagnosis not present

## 2021-10-08 LAB — CBC
HCT: 38.6 % (ref 36.0–46.0)
Hemoglobin: 12.5 g/dL (ref 12.0–15.0)
MCH: 26.6 pg (ref 26.0–34.0)
MCHC: 32.4 g/dL (ref 30.0–36.0)
MCV: 82.1 fL (ref 80.0–100.0)
Platelets: 213 10*3/uL (ref 150–400)
RBC: 4.7 MIL/uL (ref 3.87–5.11)
RDW: 14.5 % (ref 11.5–15.5)
WBC: 12.1 10*3/uL — ABNORMAL HIGH (ref 4.0–10.5)
nRBC: 0 % (ref 0.0–0.2)

## 2021-10-08 LAB — RESP PANEL BY RT-PCR (FLU A&B, COVID) ARPGX2
Influenza A by PCR: NEGATIVE
Influenza B by PCR: NEGATIVE
SARS Coronavirus 2 by RT PCR: NEGATIVE

## 2021-10-08 LAB — TYPE AND SCREEN
ABO/RH(D): B POS
Antibody Screen: NEGATIVE

## 2021-10-08 MED ORDER — LACTATED RINGERS IV SOLN: INTRAVENOUS | Status: DC

## 2021-10-08 MED ORDER — OXYTOCIN 10 UNIT/ML IJ SOLN: 10.0000 [IU] | Freq: Once | INTRAMUSCULAR | Status: DC | PRN

## 2021-10-08 MED ORDER — ACETAMINOPHEN 325 MG PO TABS
650.0000 mg | ORAL_TABLET | ORAL | Status: DC | PRN
  Administered 2021-10-08: 650 mg via ORAL
  Filled 2021-10-08: qty 2

## 2021-10-08 MED ORDER — SODIUM CHLORIDE 0.9% FLUSH
3.0000 mL | Freq: Two times a day (BID) | INTRAVENOUS | Status: DC
  Administered 2021-10-08 – 2021-10-09 (×2): 3 mL via INTRAVENOUS

## 2021-10-08 MED ORDER — ONDANSETRON HCL 4 MG PO TABS: 4.0000 mg | ORAL_TABLET | ORAL | Status: DC | PRN

## 2021-10-08 MED ORDER — ACETAMINOPHEN 325 MG PO TABS
650.0000 mg | ORAL_TABLET | ORAL | Status: DC | PRN
  Administered 2021-10-09: 650 mg via ORAL
  Filled 2021-10-08: qty 2

## 2021-10-08 MED ORDER — BENZOCAINE-MENTHOL 20-0.5 % EX AERO: 1.0000 "application " | INHALATION_SPRAY | CUTANEOUS | Status: DC | PRN

## 2021-10-08 MED ORDER — IBUPROFEN 600 MG PO TABS
600.0000 mg | ORAL_TABLET | Freq: Four times a day (QID) | ORAL | Status: DC
  Administered 2021-10-08 – 2021-10-10 (×7): 600 mg via ORAL
  Filled 2021-10-08 (×7): qty 1

## 2021-10-08 MED ORDER — MEASLES, MUMPS & RUBELLA VAC IJ SOLR: 0.5000 mL | Freq: Once | INTRAMUSCULAR | Status: DC

## 2021-10-08 MED ORDER — ONDANSETRON HCL 4 MG/2ML IJ SOLN: 4.0000 mg | INTRAMUSCULAR | Status: DC | PRN

## 2021-10-08 MED ORDER — TETANUS-DIPHTH-ACELL PERTUSSIS 5-2.5-18.5 LF-MCG/0.5 IM SUSY
0.5000 mL | PREFILLED_SYRINGE | Freq: Once | INTRAMUSCULAR | Status: AC
  Administered 2021-10-09: 0.5 mL via INTRAMUSCULAR
  Filled 2021-10-08: qty 0.5

## 2021-10-08 MED ORDER — DIPHENHYDRAMINE HCL 25 MG PO CAPS: 25.0000 mg | ORAL_CAPSULE | Freq: Four times a day (QID) | ORAL | Status: DC | PRN

## 2021-10-08 MED ORDER — PRENATAL MULTIVITAMIN CH
1.0000 | ORAL_TABLET | Freq: Every day | ORAL | Status: DC
  Administered 2021-10-09 – 2021-10-10 (×2): 1 via ORAL
  Filled 2021-10-08 (×2): qty 1

## 2021-10-08 MED ORDER — SENNOSIDES-DOCUSATE SODIUM 8.6-50 MG PO TABS
2.0000 | ORAL_TABLET | ORAL | Status: DC
  Administered 2021-10-09 – 2021-10-10 (×2): 2 via ORAL
  Filled 2021-10-08 (×2): qty 2

## 2021-10-08 MED ORDER — OXYTOCIN-SODIUM CHLORIDE 30-0.9 UT/500ML-% IV SOLN
2.5000 [IU]/h | INTRAVENOUS | Status: DC
  Administered 2021-10-08: 2.5 [IU]/h via INTRAVENOUS
  Filled 2021-10-08 (×2): qty 500

## 2021-10-08 MED ORDER — DIBUCAINE (PERIANAL) 1 % EX OINT: 1.0000 "application " | TOPICAL_OINTMENT | CUTANEOUS | Status: DC | PRN

## 2021-10-08 MED ORDER — OXYCODONE-ACETAMINOPHEN 5-325 MG PO TABS: 2.0000 | ORAL_TABLET | ORAL | Status: DC | PRN

## 2021-10-08 MED ORDER — ONDANSETRON HCL 4 MG/2ML IJ SOLN
4.0000 mg | Freq: Four times a day (QID) | INTRAMUSCULAR | Status: DC | PRN
  Administered 2021-10-08: 4 mg via INTRAVENOUS
  Filled 2021-10-08: qty 2

## 2021-10-08 MED ORDER — WITCH HAZEL-GLYCERIN EX PADS: 1.0000 "application " | MEDICATED_PAD | CUTANEOUS | Status: DC | PRN

## 2021-10-08 MED ORDER — SOD CITRATE-CITRIC ACID 500-334 MG/5ML PO SOLN: 30.0000 mL | ORAL | Status: DC | PRN

## 2021-10-08 MED ORDER — SIMETHICONE 80 MG PO CHEW: 80.0000 mg | CHEWABLE_TABLET | ORAL | Status: DC | PRN

## 2021-10-08 MED ORDER — COCONUT OIL OIL: 1.0000 "application " | TOPICAL_OIL | Status: DC | PRN

## 2021-10-08 MED ORDER — ZOLPIDEM TARTRATE 5 MG PO TABS: 5.0000 mg | ORAL_TABLET | Freq: Every evening | ORAL | Status: DC | PRN

## 2021-10-08 MED ORDER — LACTATED RINGERS IV SOLN: 500.0000 mL | INTRAVENOUS | Status: DC | PRN

## 2021-10-08 MED ORDER — OXYCODONE-ACETAMINOPHEN 5-325 MG PO TABS: 1.0000 | ORAL_TABLET | ORAL | Status: DC | PRN

## 2021-10-08 MED ORDER — SODIUM CHLORIDE 0.9% FLUSH: 3.0000 mL | INTRAVENOUS | Status: DC | PRN

## 2021-10-08 MED ORDER — LIDOCAINE HCL (PF) 1 % IJ SOLN: 30.0000 mL | INTRAMUSCULAR | Status: DC | PRN

## 2021-10-08 MED ORDER — SODIUM CHLORIDE 0.9 % IV SOLN: 250.0000 mL | INTRAVENOUS | Status: DC | PRN

## 2021-10-08 MED ORDER — OXYTOCIN BOLUS FROM INFUSION
333.0000 mL | Freq: Once | INTRAVENOUS | Status: AC
  Administered 2021-10-08: 333 mL via INTRAVENOUS

## 2021-10-08 MED ORDER — FENTANYL CITRATE (PF) 100 MCG/2ML IJ SOLN
100.0000 ug | INTRAMUSCULAR | Status: DC | PRN
  Administered 2021-10-08 (×2): 100 ug via INTRAVENOUS
  Filled 2021-10-08 (×2): qty 2

## 2021-10-08 NOTE — H&P (Signed)
OBSTETRIC ADMISSION HISTORY AND PHYSICAL  Kerry Lucas is a 21 y.o. female G2P1001 with IUP at [redacted]w[redacted]d by 10 wk U/S presenting for SROM and SOL since 0900 this morning.   Reports fetal movement. Denies vaginal bleeding.  She received her prenatal care at  [facility] .  Support person in labor: Kelvin (FOB)  Ultrasounds Anatomy U/S: Echogenic focus of left ventricle on 18 wk [place]  Prenatal History/Complications: H/O Pregnancy/Delivery at 21 yo (01/17/2015) H/O Vacuum-extracted Vaginal Delivery H/O Small for Gestational Age Infant (5 lbs 6 oz 2438 gm at 38.6 wks) H/O Gonorrhea 2021 H/O Hidradenitis Suppurativa  OB BOX:  Nursing Staff Provider  Office Location  [facility] Dating  Early [place]  Language  English Anatomy US  Echogenic focus of left ventricle on 18 wk [place], repeat at 31wk doesn't mention, has follow up scheduled  Flu Vaccine   Genetic Screen  NIPS:   AFP:   First Screen:  Quad: too late   TDaP vaccine    Hgb A1C or  GTT  Third trimester: 1hr normal 115   Rhogam  N/A   LAB RESULTS   Feeding Plan breast Blood Type   B pos 08/26/2021  Contraception IUD or depo Antibody   Negative  08/26/2021  Circumcision  Rubella  Immune  08/26/2021  Pediatrician   RPR   Nonreactive  08/26/2021  Support Person  HBsAg   non-reactive  08/26/2021  Prenatal Classes  HIV  negative  08/26/2021  BTL Consent N/A GBS  Negative (For PCN allergy, check sensitivities)   VBAC Consent  Pap Normal  08/26/2021    Hgb Electro      CF     SMA     Waterbirth  [ ]  Class [ ]  Consent [ ]  CNM visit   Past Medical History: Past Medical History:  Diagnosis Date   Anxiety    Chlamydia    Depression    denies issues   Gonorrhea    Headache    Trichomonas infection    UTI (urinary tract infection)     Past Surgical History: Past Surgical History:  Procedure Laterality Date   HERNIA REPAIR Right 2017    Obstetrical History: OB History     Gravida  2   Para  1   Term  1   Preterm       AB      Living  1      SAB      IAB      Ectopic      Multiple      Live Births  1           Social History: Social History   Socioeconomic History   Marital status: Single    Spouse name: Not on file   Number of children: Not on file   Years of education: Not on file   Highest education level: Not on file  Occupational History   Not on file  Tobacco Use   Smoking status: Former    Types: Cigarettes   Smokeless tobacco: Never   Tobacco comments:    Stopped cigarettes in June 2022  Vaping Use   Vaping Use: Never used  Substance and Sexual Activity   Alcohol use: Never   Drug use: Not Currently    Types: Marijuana   Sexual activity: Yes  Other Topics Concern   Not on file  Social History Narrative   Not on file   Social Determinants of Health  Financial Resource Strain: Not on file  Food Insecurity: Not on file  Transportation Needs: Not on file  Physical Activity: Not on file  Stress: Not on file  Social Connections: Not on file    Family History: Family History  Problem Relation Age of Onset   Healthy Mother    Hypertension Father    Diabetes Father     Allergies: No Known Allergies  Medications Prior to Admission  Medication Sig Dispense Refill Last Dose   Prenatal Vit-Fe Fumarate-FA (PRENATAL VITAMIN PO) Take by mouth.   10/07/2021   cyclobenzaprine (FLEXERIL) 5 MG tablet Take 1 tablet (5 mg total) by mouth 3 (three) times daily as needed for muscle spasms. 20 tablet 0    ondansetron (ZOFRAN ODT) 4 MG disintegrating tablet Take 1 tablet (4 mg total) by mouth every 8 (eight) hours as needed for nausea or vomiting. 5 tablet 0      Review of Systems  All systems reviewed and negative except as stated in HPI  Blood pressure 139/67, pulse 100, temperature 97.9 F (36.6 C), temperature source Oral, resp. rate 18, height 5' (1.524 m), weight 61.1 kg, last menstrual period 11/20/2020, SpO2 99 %. General appearance: alert, cooperative,  appears stated age, and mild distress Lungs: no respiratory distress Heart: regular rate  Abdomen: soft, non-tender; gravid Pelvic: adequate Extremities: Homans sign is negative, no sign of DVT Presentation: cephalic Fetal monitoring: 135 bpm / moderate variability / accels present / decels absent Uterine activity: regular every 3 mins  Dilation: 6 Effacement (%): 90 Station: 0 Exam by:: Laury Deep, cnm Forebag felt  Prenatal labs: ABO, Rh: --/--/B POS (01/31 1605) Antibody: NEG (01/31 1605) Rubella: Immune (12/19 0000) RPR: Nonreactive (12/19 0000)  HBsAg:  Non-Reactive HIV: Non-reactive (12/19 0000)  GBS: Negative/-- (01/24 1542)  Glucola: Normal 115 Genetic screening: Normal  Prenatal Transfer Tool  Maternal Diabetes: No Genetic Screening: Normal Maternal Ultrasounds/Referrals: Isolated EIF (echogenic intracardiac focus) Fetal Ultrasounds or other Referrals:  None Maternal Substance Abuse:  No Significant Maternal Medications:  None Significant Maternal Lab Results: Group B Strep negative  Results for orders placed or performed during the hospital encounter of 10/08/21 (from the past 24 hour(s))  CBC   Collection Time: 10/08/21  4:03 PM  Result Value Ref Range   WBC 12.1 (H) 4.0 - 10.5 K/uL   RBC 4.70 3.87 - 5.11 MIL/uL   Hemoglobin 12.5 12.0 - 15.0 g/dL   HCT 38.6 36.0 - 46.0 %   MCV 82.1 80.0 - 100.0 fL   MCH 26.6 26.0 - 34.0 pg   MCHC 32.4 30.0 - 36.0 g/dL   RDW 14.5 11.5 - 15.5 %   Platelets 213 150 - 400 K/uL   nRBC 0.0 0.0 - 0.2 %  Type and screen [place]   Collection Time: 10/08/21  4:05 PM  Result Value Ref Range   ABO/RH(D) B POS    Antibody Screen NEG    Sample Expiration      10/11/2021,2359 Performed at [facility], [place] [address], [place], [place] [address]     Patient Active Problem List   Diagnosis Date Noted   Amniotic fluid leaking 10/08/2021   Indication for care in labor or delivery 10/08/2021    Supervision of other normal pregnancy, antepartum 10/06/2021   Fetal echogenic intracardiac focus on prenatal ultrasound 06/20/2021   History of anxiety 04/17/2021   History of vacuum extraction assisted delivery 04/17/2021   Acute cystitis without hematuria 06/14/2020  Abdominal pain, chronic, right lower quadrant 06/11/2020   Routine screening for STI (sexually transmitted infection) 06/11/2020   Vaginal discharge 06/11/2020   Dysuria 06/11/2020    Assessment/Plan:  Kerry Lucas is a 21 y.o. G2P1001 at [redacted]w[redacted]d here for SROM and SOL since 0900.  Labor: active -- pain control: planning natural labor -- wanting to get in shower x 30 mins -- plan AROM of forebag after out of shower  Fetal Wellbeing: EFW 5-6 lbs by Leopold's. Cephalic by SVE.  -- GBS (Neg) -- continuous fetal monitoring - category 1   Postpartum Planning -- breast -- contraception plans: Depo   Laury Deep, CNM  10/08/2021, 4:07 PM

## 2021-10-08 NOTE — Progress Notes (Signed)
Chaplain responded to neonatal Code Blue call from unit. In the three minutes it took Chaplain to arrive, the Code Blue had been cancelled.  No further need for the Chaplain at this point.  Vernell Morgans Chaplain

## 2021-10-08 NOTE — Telephone Encounter (Signed)
Patient left VM on nurse line at [phone] stating she is in severe pain that does not seem to be getting any better. Reports a gush of fluid while on the toilet. States this appeared clear with a yellowish tint.   Called pt at 0950. Pt reports pain has not improved. Reports this pain is happening with contractions. Unable to recall exactly how frequently contractions are happening. Patient's partner believes they are happening about every 10 minutes. Pt reports no more gushes of fluid, but has continued to have small amount of leaking fluid. Instructed pt to go immediately to MAU. Pt states she does have someone that can take her to MAU. Report called to MAU.

## 2021-10-08 NOTE — MAU Note (Signed)
Been contracting since ~0900 this morning. Was 1 cm when last checked.  Saw some blood when used the bathroom earlier. Had a fluid gush after she peed around 0900, little bit of leaking since. +FM reported

## 2021-10-08 NOTE — Lactation Note (Signed)
This note was copied from a baby's chart. Lactation Consultation Note  Patient Name: Kerry Lucas [id number] Date: 10/08/2021 Age:21 hours  Attempted LC visit after delivery. RN is assisting mother at the moment.  LC will come back to room at another time as possible.       Bryley Kovacevic A Higuera Ancidey 10/08/2021, 8:41 PM

## 2021-10-08 NOTE — Discharge Summary (Signed)
Postpartum Discharge Summary  Date of Service updated     Patient Name: Kerry Lucas DOB: [birth year] MRN: [id number]  Date of admission: 10/08/2021 Delivery date:10/08/2021  Delivering provider: Patriciaann Clan  Date of discharge: 10/10/2021  Admitting diagnosis: Amniotic fluid leaking [O42.90] Intrauterine pregnancy: [redacted]w[redacted]d     Secondary diagnosis:  Principal Problem:   Indication for care in labor or delivery Active Problems:   Fetal echogenic intracardiac focus on prenatal ultrasound   Amniotic fluid leaking   Intrauterine pregnancy in teenager  Additional problems:     Discharge diagnosis: Term Pregnancy Delivered                                              Post partum procedures: None Augmentation: AROM Complications: None  Hospital course: Onset of Labor With Vaginal Delivery      21 y.o. yo G2P1001 at [redacted]w[redacted]d was admitted in Active Labor on 10/08/2021. Patient had an uncomplicated labor course as follows:  Membrane Rupture Time/Date: 9:00 AM ,10/08/2021   Delivery Method:Vaginal, Spontaneous  Episiotomy: None  Lacerations:  None  Patient had an uncomplicated postpartum course.  She is ambulating, tolerating a regular diet, passing flatus, and urinating well. Patient is discharged home in stable condition on 10/10/21.  Newborn Data: Birth date:10/08/2021  Birth time:8:19 PM  Gender:Female  Living status:Living  Apgars:5 ,9  Weight:2980 g   Magnesium Sulfate received: No BMZ received: No Rhophylac:No MMR:No T-DaP:Given prenatally Flu: No Transfusion:No  Physical exam  Vitals:   10/09/21 1028 10/09/21 1310 10/09/21 2100 10/10/21 0533  BP: 107/63 120/69 117/77 (!) 102/55  Pulse: 66 80 82 82  Resp: $Remo'18 16 16 16  'mNBVk$ Temp:  98.4 F (36.9 C) 99 F (37.2 C) 98.5 F (36.9 C)  TempSrc:   Oral Oral  SpO2:      Weight:      Height:       General: alert, cooperative, and no distress Lochia: appropriate Uterine Fundus: firm Incision: N/A DVT Evaluation: No  significant calf/ankle edema. Labs: Lab Results  Component Value Date   WBC 12.1 (H) 10/08/2021   HGB 12.5 10/08/2021   HCT 38.6 10/08/2021   MCV 82.1 10/08/2021   PLT 213 10/08/2021   CMP Latest Ref Rng & Units 12/14/2020  Glucose 70 - 99 mg/dL 98  BUN 6 - 20 mg/dL <5(L)  Creatinine 0.44 - 1.00 mg/dL 0.77  Sodium 135 - 145 mmol/L 135  Potassium 3.5 - 5.1 mmol/L 4.0  Chloride 98 - 111 mmol/L 105  CO2 22 - 32 mmol/L 25  Calcium 8.9 - 10.3 mg/dL 9.0  Total Protein 6.5 - 8.1 g/dL 6.6  Total Bilirubin 0.3 - 1.2 mg/dL 0.4  Alkaline Phos 38 - 126 U/L 77  AST 15 - 41 U/L 19  ALT 0 - 44 U/L 12   Edinburgh Score: No flowsheet data found.   After visit meds:  Allergies as of 10/10/2021   No Known Allergies      Medication List     STOP taking these medications    cyclobenzaprine 5 MG tablet Commonly known as: FLEXERIL   ondansetron 4 MG disintegrating tablet Commonly known as: Zofran ODT       TAKE these medications    acetaminophen 325 MG tablet Commonly known as: Tylenol Take 2 tablets (650 mg total) by mouth every 4 (four) hours as  needed (for pain scale < 4).   ibuprofen 600 MG tablet Commonly known as: ADVIL Take 1 tablet (600 mg total) by mouth every 6 (six) hours as needed.   PRENATAL VITAMIN PO Take by mouth.         Discharge home in stable condition Infant Feeding: Breast Infant Disposition:home with mother Discharge instruction: per After Visit Summary and Postpartum booklet. Activity: Advance as tolerated. Pelvic rest for 6 weeks.  Diet: routine diet Future Appointments: Future Appointments  Date Time Provider [place]  11/20/2021  2:15 PM Patriciaann Clan, DO [employer] [facility]   Follow up Visit:  Message sent to [facility] by Dr Higinio Plan: Please schedule this patient for a In person postpartum visit in 6 weeks with the following provider: Any provider. Additional Postpartum F/U: None   Low risk pregnancy complicated by:  History of  VAVD Delivery mode:  Vaginal, Spontaneous  Anticipated Birth Control:  Depo   10/10/2021 Patriciaann Clan, DO

## 2021-10-08 NOTE — Telephone Encounter (Signed)
Received report from [place] this morning that patient had been advised to present to MAU for confirmation of ROM/labor check. Patient called at home at 1330 hours. Identity confirmed x 2. Patient states she continues to leak fluid, pain is improving. She states to CNM she has been sent home from MAU twice and has decided she will not return until she is absolutely certain she is laboring and water has broken. Reassured patient her described symptoms merit MAU evaluation ASAP. Pt verbalized understanding. Denies questions at end of call.  Mallie Snooks, MSA, MSN, CNM Certified Nurse Midwife, [facility] for [employer], [facility] 10/08/21 1:32 PM

## 2021-10-08 NOTE — Progress Notes (Addendum)
Kerry Lucas is a 21 y.o. G2P1001 at [redacted]w[redacted]d by ultrasound admitted for active labor, rupture of membranes  Subjective: Patient has been in and out of shower. Pain managed with Fentanyl x 1 dose just now. Kelvin, FOB, supportive at bedside.  Objective: BP 132/76    Pulse (!) 101    Temp 97.9 F (36.6 C) (Oral)    Resp 18    Ht 5' (1.524 m)    Wt 61.1 kg    LMP 11/20/2020    SpO2 99%    BMI 26.29 kg/m  No intake/output data recorded. No intake/output data recorded.  FHT:  FHR: 125 bpm, variability: moderate,  accelerations:  Present,  decelerations:  Present variables UC:   regular, every 2.5-3 minutes SVE:   Dilation: 7.5 Effacement (%): 90 Station: Plus 1 Exam by:: Renato Battles, cnm AROM forebag with small amount of clear fluid in return. Patient tolerated procedure well.  Labs: Lab Results  Component Value Date   WBC 12.1 (H) 10/08/2021   HGB 12.5 10/08/2021   HCT 38.6 10/08/2021   MCV 82.1 10/08/2021   PLT 213 10/08/2021    Assessment / Plan: Spontaneous labor, progressing normally  Labor: Progressing normally Preeclampsia:   n/a Fetal Wellbeing:  Category I Pain Control:  IV pain meds I/D:   GBS Neg Anticipated MOD:  NSVD  Laury Deep, CNM 10/08/2021, 7:02 PM

## 2021-10-09 ENCOUNTER — Telehealth: Payer: Self-pay

## 2021-10-09 LAB — RPR: RPR Ser Ql: NONREACTIVE

## 2021-10-09 MED ORDER — OXYCODONE HCL 5 MG PO TABS
5.0000 mg | ORAL_TABLET | ORAL | Status: DC | PRN
  Administered 2021-10-09 (×2): 5 mg via ORAL
  Filled 2021-10-09 (×2): qty 1

## 2021-10-09 NOTE — Clinical Social Work Maternal (Signed)
°CLINICAL SOCIAL WORK MATERNAL/CHILD NOTE ° °Patient Details  °Name: Kerry Lucas °MRN: [id number] °Date of Birth: 06/16/2001 ° °Date:  10/09/2021 ° °Clinical Social Worker Initiating Note:  Elizibeth Breau, LCSW Date/Time: Initiated:  10/09/21/1228    ° °Child's Name:  Kerry Lucas  ° °Biological Parents:  Mother, Father (Kerry Lucas 12/25/2001, Kerry Lucas 04-30-1986)  ° °Need for Interpreter:  None  ° °Reason for Referral:  Current Substance Use/Substance Use During Pregnancy  , Behavioral Health Concerns  ° °Address:  [address] °[place] [address]  °  °Phone number:  [phone] (home)    ° °Additional phone number:  ° °Household Members/Support Persons (HM/SP):   Household Member/Support Person 1, Household Member/Support Person 2 ° ° °HM/SP Name Relationship DOB or Age  °HM/SP -1 Kerry Masella Significant Other 04-30-1986  °HM/SP -2 Brielle Lester Daughter 01-17-2015  °HM/SP -3        °HM/SP -4        °HM/SP -5        °HM/SP -6        °HM/SP -7        °HM/SP -8        ° ° °Natural Supports (not living in the home):  Friends  ° °Professional Supports: None  ° °Employment:    ° °Type of Work:    ° °Education:  High school graduate  ° °Homebound arranged:   ° °Financial Resources:  Medicaid  ° °Other Resources:  Food Stamps  , WIC  ° °Cultural/Religious Considerations Which May Impact Care:   ° °Strengths:  Ability to meet basic needs  , Home prepared for child  , Pediatrician chosen  ° °Psychotropic Medications:        ° °Pediatrician:    [place] area ° °Pediatrician List:  ° ° [facility] ([address])  °[place]    °[place]    °[place]    °[place]    °[place]    ° ° °Pediatrician Fax Number:   ° °Risk Factors/Current Problems:  None  ° °Cognitive State:  Able to Concentrate  , Insightful    ° °Mood/Affect:  Calm  , Comfortable    ° °CSW Assessment: CSW received consult for hx of Anxiety, Depression, THC use.  CSW  met with MOB to offer support and complete assessment.   ° °CSW met with MOB at bedside and introduced CSW role. CSW observed MOB sitting up in bed and support person at bedside. MOB presented calm and receptive to CSW visit. CSW offered MOB privacy. MOB introduced her support person as her best friend Kerry Lucas and gave CSW permission to share all information in front of her support. MOB confirmed that the demographic information on hospital file is correct. MOB reported that she, FOB, and her daughter live together (see chart above). MOB identified FOB and her friend as supports. MOB reported that she is currently unemployed, and FOB works at [employer]. MOB reported she receives FS/WIC and will notify both. °  °CSW inquired how MOB has felt since giving birth. MOB reported feeling "achy" but very excited and happy about the baby. MOB shared that she feels like a “superstar” because she delivered the baby without an epidural. CSW inquired about MOB history of anxiety and depression. MOB reported that she longer has concerns with anxiety and depression. She felt very happy during the pregnancy. MOB reported she was diagnosed with depression and anxiety at age 15 while   [place] and saw a therapist at time that she felt was helpful. MOB reported she did not use medication to treat her symptoms. CSW inquired about MOB coping strategies. MOB reported she engages in exercise, listening to music, praying, and talking her issues out with her identified supports. CSW praised MOB for her efforts and encouraged to continue implementing her coping strategies. MOB reported she did not experience PPD with her older child however she is knowledgeable of the symptoms. CSW provided education regarding the baby blues period vs. perinatal mood disorders, discussed treatment and gave resources for mental health follow up if concerns arise.CSW recommended MOB complete a self-evaluation during the postpartum time period using  the New Mom Checklist from Postpartum Progress and encouraged MOB to contact a medical professional if symptoms are noted at any time.  MOB denied SI/HI and domestic violence.  ° °CSW inquired about MOB THC use. MOB reported that she stopped using marijuana after learning that she was pregnant. MOB reported that she was around marijuana smoke but did not use. CSW informed MOB about the hospital drug screen policy. MOB made aware that CSW will follow the infant's UDS/CDS and make a report to CPS, if warranted. MOB reported understanding. CSW inquired if MOB had CPS history. MOB reported that she had CPS involvement with [place] in 2021 because her daughter was late for school for a week. MOB reported the case since was closed and stated no other CPS involvement.  ° °MOB reported she has essential items for the infant including a crib where the infant will sleep. MOB reported that she needs assistance with diapers and wipes. CSW educated MOB about [facility]. MOB gave CSW permission to make a referral. CSW provided review of Sudden Infant Death Syndrome (SIDS) precautions. MOB has chosen [facility] for the infant's follow up care and reported she will have transportation to the appointment. CSW assessed MOB for additional needs. MOB reported no further need.  ° °CSW identifies no further need for intervention and no barriers to discharge at this time.  ° °CSW Plan/Description:  Sudden Infant Death Syndrome (SIDS) Education, CSW Will Continue to Monitor Umbilical Cord Tissue Drug Screen Results and Make Report if Warranted, Hospital Drug Screen Policy Information, Perinatal Mood and Anxiety Disorder (PMADs) Education, No Further Intervention Required/No Barriers to Discharge, Other Information/Referral to Community Resources  ° ° °Carlen Fils A Ziya Coonrod, LCSW °10/09/2021, 12:36 PM °

## 2021-10-09 NOTE — Telephone Encounter (Signed)
Pt called and stated that she has delivered her baby does she need to keep her appt on the 10/11/21.  Notified pt that her prenatal appt has been cancelled and a pp visit scheduled for 11/20/21.   Pt verbalized understanding.  Leonette Nutting  10/09/21

## 2021-10-09 NOTE — Lactation Note (Signed)
This note was copied from a baby's chart. Lactation Consultation Note  Patient Name: Boy Janaisa Birkland [id number] Date: 10/09/2021 Reason for consult: Initial assessment Age:20 hours  P2, Baby cueing on mother's chest.   Reviewed hand expression with good flow. Baby latched easily with intermittent swallows.  Feed on demand with cues.  Goal 8-12+ times per day after first 24 hrs.  Place baby STS if not cueing.  Mom made aware of O/P services, breastfeeding support groups, community resources, and our phone # for post-discharge questions.    Maternal Data Has patient been taught Hand Expression?: Yes Does the patient have breastfeeding experience prior to this delivery?: Yes How long did the patient breastfeed?: one month  Feeding Mother's Current Feeding Choice: Breast Milk and Formula  LATCH Score Latch: Grasps breast easily, tongue down, lips flanged, rhythmical sucking.  Audible Swallowing: A few with stimulation  Type of Nipple: Everted at rest and after stimulation  Comfort (Breast/Nipple): Soft / non-tender  Hold (Positioning): Assistance needed to correctly position infant at breast and maintain latch.  LATCH Score: 8    Interventions Interventions: Breast feeding basics reviewed;Assisted with latch;Skin to skin;Hand express;Education;LC Services brochure  Consult Status Consult Status: Follow-up Date: 10/10/21 Follow-up type: In-patient    Dahlia Byes [facility] 10/09/2021, 9:31 AM

## 2021-10-09 NOTE — Progress Notes (Signed)
POSTPARTUM PROGRESS NOTE  Post Partum Day 1  Subjective:  Kerry Lucas is a 21 y.o. [id number] s/p SVD at [redacted]w[redacted]d.  She reports she is doing well, delivered late last night. No acute events overnight. She denies any problems with ambulating, voiding or po intake. Denies nausea or vomiting.  Pain is well controlled after oxycodone dose for cramping.  Lochia is mild.  Objective: Blood pressure (!) 122/55, pulse 71, temperature 98 F (36.7 C), temperature source Oral, resp. rate 15, height 5' (1.524 m), weight 61.1 kg, last menstrual period 11/20/2020, SpO2 99 %, unknown if currently breastfeeding.  Physical Exam:  General: alert, cooperative and no distress Chest: no respiratory distress Heart:regular rate, distal pulses intact Uterine Fundus: firm, appropriately tender DVT Evaluation: No calf swelling or tenderness Extremities: no edema Skin: warm, dry  Recent Labs    10/08/21 1603  HGB 12.5  HCT 38.6    Assessment/Plan: Helaine Yackel is a 21 y.o. [id number] s/p SVD at [redacted]w[redacted]d   PPD#1 - Doing well  Routine postpartum care  Contraception: depo Feeding: breast Dispo: Plan for discharge tomorrow.    LOS: 1 day   Leticia Penna, DO  OB Fellow  10/09/2021, 7:45 AM

## 2021-10-10 ENCOUNTER — Other Ambulatory Visit (HOSPITAL_COMMUNITY): Payer: Self-pay

## 2021-10-10 MED ORDER — IBUPROFEN 600 MG PO TABS
600.0000 mg | ORAL_TABLET | Freq: Four times a day (QID) | ORAL | 0 refills | Status: DC | PRN
  Filled 2021-10-10: qty 60, 15d supply, fill #0

## 2021-10-10 MED ORDER — ACETAMINOPHEN 325 MG PO TABS: 650.0000 mg | ORAL_TABLET | ORAL | Status: DC | PRN

## 2021-10-10 MED ORDER — MEDROXYPROGESTERONE ACETATE 150 MG/ML IM SUSP
150.0000 mg | Freq: Once | INTRAMUSCULAR | Status: AC
Start: 2021-10-10 — End: 2021-10-10
  Administered 2021-10-10: 150 mg via INTRAMUSCULAR
  Filled 2021-10-10: qty 1

## 2021-10-10 NOTE — Lactation Note (Signed)
This note was copied from a baby's chart. Lactation Consultation Note  Patient Name: Kerry Lucas [id number] Date: 10/10/2021 Reason for consult: Follow-up assessment;Early term 37-38.6wks Age:20 hours  LC in to visit with P1 Mom of ET infant on day of discharge.  Baby at 3% weight loss and Mom is offering formula supplement if baby is fussy and not able to latch and feed well at the breast.  Mom reports a 20 min feeding right before LC came in room.  Mom denied painful latches, though is concerned that right nipple has a tiny blood blister on it.  Reassured Mom that baby may have slipped onto nipple.  Talked about how to support baby in closely and break the suction if latch is painful.    LC attempted to assess baby's latch to the breast, but baby had a small emesis of what looked like milk and was too sleepy to latch again.  Reviewed positioning for Mom.  Engorgement prevention and treatment reviewed.  Mom has a DEBP for home use.  Mom also has [place] support.  WIC called earlier today to support her breastfeeding efforts.  Kohler asked about a pump loaner and Mom was told a nutritionist would call her and assess her need.   Mom aware of OP lactation support and encouraged to call prn for concerns.  Interventions Interventions: Breast feeding basics reviewed;Skin to skin;Breast massage;Hand express;Support pillows;Assisted with latch;Position options  Discharge Discharge Education: Engorgement and breast care;Warning signs for feeding baby Pump: Personal (Evenflow DEBP)  Consult Status Consult Status: Complete Date: 10/10/21 Follow-up type: Call as needed    Kerry Lucas 10/10/2021, 12:06 PM

## 2021-10-11 ENCOUNTER — Encounter: Payer: Medicaid Other | Admitting: Family

## 2021-10-16 ENCOUNTER — Ambulatory Visit: Payer: Medicaid Other

## 2021-10-19 ENCOUNTER — Telehealth (HOSPITAL_COMMUNITY): Payer: Self-pay

## 2021-10-19 NOTE — Telephone Encounter (Signed)
"  Doing good, trying to keep breastfeeding. My milk is slowing down. This happened with my other children. I'm only breastfeeding and feeding on demand." RN told patient to continue to feed on demand and that baby should nurse atleast 8-12 times a day. RN reviewed supply and demand. RN explained importance of staying well hydrated and eating well to maintain a good milk supply. RN reviewed LC resources that [facility] has available and will e-mail those to patient. "My blood has a foul odor. Is that normal?" RN told patient that if she has a foul vaginal odor that she should call her OB and let them know. Patient has no other questions or concerns about her healing.  "Doing good. Baby has gotten bigger, he is gaining weight. His cheeks are getting chunky. He is latching on well. He feeds frequently at times." RN reviewed cluster feeding. "He sleeps in a crib." RN reviewed ABC's of safe sleep with patient. Patient declines any questions or concerns about baby.  EPDS score is 0.  Kerry Lucas [facility] 10/19/2021,1624

## 2021-11-20 ENCOUNTER — Ambulatory Visit: Payer: Medicaid Other | Admitting: Family Medicine

## 2021-12-06 ENCOUNTER — Telehealth: Payer: Self-pay | Admitting: *Deleted

## 2021-12-06 NOTE — Telephone Encounter (Signed)
Patient called and left a  voice message today stating she is having periodic spotting. States she got depo-provera before she left the hospital in January. Wants to know if she should be checked. States she has had some blood last 3 days , enough that she thought period would start, but it hasn't. Wants to know if she should get checked.  ?I called Marianna and we discussed she had her baby on 10/08/21 and was d/c on 10/10/21 . She had depo-provera on 10/10/21. She reports she breastfeed for first month then switched to breastfeeding. She states she bleed for 6 weeks then stopped for about 2 weeks, and then last 3 days had spotting and what she thought was her period last night, but no bleeding this am. She reports she had intercourse around 3/27 before spotting began. She reports intermittent mild cramping last few days. I informed her I see she missed her postpartum visit and recommend she reschedule that. I advised I can schedule her next depo-provera and she agreed and I scheduled appointment.  ?I advised that it is normal to have irregular bleeding after having a baby, breastfeeding, stopping breastfeeding, starting depo-provera. I explained it takes 2-3 depo-provera injections for her bleeding to regulate. I advised if heavy bleeding needs to be evaluated. I advised if thinks she could be pregnant, take upt. I advised may take ibuprofen for cramps. If severe pain , needs to be evaluated at hospital. She voices understanding. ?Nancy Fetter ? ?

## 2021-12-26 ENCOUNTER — Ambulatory Visit: Payer: Medicaid Other

## 2021-12-30 ENCOUNTER — Ambulatory Visit: Payer: Medicaid Other

## 2021-12-31 ENCOUNTER — Ambulatory Visit: Payer: Medicaid Other | Admitting: Student

## 2022-01-01 ENCOUNTER — Telehealth: Payer: Self-pay | Admitting: General Practice

## 2022-01-01 NOTE — Telephone Encounter (Signed)
Patient called and left message on nurse voicemail line stating she needs to reschedule her depo appt and an appt for something else as well. ? ?Called patient and she states she thinks she has a yeast infection & a UTI and would like to be checked for both. Offered work in appt tomorrow morning at 940am. Patient verbalized understanding.  ?

## 2022-01-02 ENCOUNTER — Other Ambulatory Visit (HOSPITAL_COMMUNITY)
Admission: RE | Admit: 2022-01-02 | Discharge: 2022-01-02 | Disposition: A | Discharge status: Home / Self Care | Payer: Medicaid Other | Source: Ambulatory Visit | Attending: Family Medicine | Admitting: Family Medicine

## 2022-01-02 ENCOUNTER — Ambulatory Visit (INDEPENDENT_AMBULATORY_CARE_PROVIDER_SITE_OTHER): Payer: Medicaid Other

## 2022-01-02 VITALS — BP 122/66 | HR 87 | Ht 61.0 in | Wt 124.6 lb

## 2022-01-02 DIAGNOSIS — Z3042 Encounter for surveillance of injectable contraceptive: Secondary | ICD-10-CM

## 2022-01-02 DIAGNOSIS — N898 Other specified noninflammatory disorders of vagina: Secondary | ICD-10-CM

## 2022-01-02 DIAGNOSIS — R300 Dysuria: Secondary | ICD-10-CM

## 2022-01-02 LAB — POCT URINALYSIS DIP (DEVICE)
Bilirubin Urine: NEGATIVE
Glucose, UA: NEGATIVE mg/dL
Ketones, ur: NEGATIVE mg/dL
Nitrite: NEGATIVE
Protein, ur: 30 mg/dL — AB
Specific Gravity, Urine: 1.025 (ref 1.005–1.030)
Urobilinogen, UA: 0.2 mg/dL (ref 0.0–1.0)
pH: 6 (ref 5.0–8.0)

## 2022-01-02 MED ORDER — MEDROXYPROGESTERONE ACETATE 150 MG/ML IM SUSP
150.0000 mg | Freq: Once | INTRAMUSCULAR | Status: AC
  Administered 2022-01-02: 150 mg via INTRAMUSCULAR

## 2022-01-02 NOTE — Progress Notes (Signed)
Kerry Lucas here for Depo-Provera Injection. Injection administered without complication. Patient will return in 3 months for next injection between July 13 and April 03, 2022. Next annual visit due February 2024.  ?Last Depo was 10/10/2021 post delivery.  ? ?Kerry Jarvis, RN ?01/02/2022  9:36 AM ? ?Pt states having vaginal discharge with odor x 5 days and also happening after intercourse. Pt has hx of STI and BV in past.  Pt also states having dysuria and frequency for past 5 days as well. Pt has not taken any OTC meds for treatment. Self swab collected today and urine dip done in office . Pt advised results will take 24-48 hours and will see results in mychart and will be notified if needs further treatment. Pt verbalized understanding.  ? ?UA: small Leukocytes and trace blood. Will send UC today for further evaluation before treatment.  ? ?Zabdiel Dripps,RN  ? ? ?

## 2022-01-03 ENCOUNTER — Other Ambulatory Visit: Payer: Self-pay | Admitting: Medical

## 2022-01-03 DIAGNOSIS — B3731 Acute candidiasis of vulva and vagina: Secondary | ICD-10-CM

## 2022-01-03 DIAGNOSIS — B9689 Other specified bacterial agents as the cause of diseases classified elsewhere: Secondary | ICD-10-CM

## 2022-01-03 LAB — CERVICOVAGINAL ANCILLARY ONLY
Bacterial Vaginitis (gardnerella): POSITIVE — AB
Candida Glabrata: NEGATIVE
Candida Vaginitis: POSITIVE — AB
Chlamydia: NEGATIVE
Comment: NEGATIVE
Comment: NEGATIVE
Comment: NEGATIVE
Comment: NEGATIVE
Comment: NEGATIVE
Comment: NORMAL
Neisseria Gonorrhea: NEGATIVE
Trichomonas: NEGATIVE

## 2022-01-03 MED ORDER — FLUCONAZOLE 100 MG PO TABS: 100.0000 mg | ORAL_TABLET | Freq: Every day | ORAL | 0 refills | Status: AC

## 2022-01-03 MED ORDER — METRONIDAZOLE 500 MG PO TABS: 500.0000 mg | ORAL_TABLET | Freq: Two times a day (BID) | ORAL | 0 refills | Status: DC

## 2022-01-04 LAB — URINE CULTURE

## 2022-03-20 ENCOUNTER — Ambulatory Visit: Payer: Medicaid Other

## 2022-03-28 ENCOUNTER — Other Ambulatory Visit: Payer: Self-pay

## 2022-03-28 ENCOUNTER — Ambulatory Visit (INDEPENDENT_AMBULATORY_CARE_PROVIDER_SITE_OTHER): Payer: Medicaid Other | Admitting: *Deleted

## 2022-03-28 VITALS — BP 110/60 | HR 57 | Ht 61.0 in | Wt 131.3 lb

## 2022-03-28 DIAGNOSIS — Z3042 Encounter for surveillance of injectable contraceptive: Secondary | ICD-10-CM | POA: Diagnosis not present

## 2022-03-28 MED ORDER — MEDROXYPROGESTERONE ACETATE 150 MG/ML IM SUSP
150.0000 mg | Freq: Once | INTRAMUSCULAR | Status: AC
  Administered 2022-03-28: 150 mg via INTRAMUSCULAR

## 2022-03-28 NOTE — Progress Notes (Signed)
Depo Provera injection administered as scheduled. Pt tolerated well. She states this is her 4th injection since birth of baby in January and reports getting periods approximately q3 weeks - lasting 7-10 days. She is not positive about the exact interval of bleeding but states that it is often. Pt was advised to track her episodes of bleeding with a menstrual calendar app.  Per chart review, pt DNKA for PP exam on 11/20/21 or for Annual Gyn exam on 12/31/21.  She was advised of need for Annual Gyn exam and agreed to schedule appointment. Next Depo injection due 10/6-10/9. She voiced understanding of all information and instructions given.

## 2022-05-21 ENCOUNTER — Emergency Department (HOSPITAL_COMMUNITY): Payer: Medicaid Other

## 2022-05-21 ENCOUNTER — Emergency Department (HOSPITAL_COMMUNITY)
Admission: EM | Admit: 2022-05-21 | Discharge: 2022-05-21 | Disposition: A | Discharge status: Home / Self Care | Payer: Medicaid Other | Attending: Emergency Medicine | Admitting: Emergency Medicine

## 2022-05-21 ENCOUNTER — Telehealth: Payer: Self-pay | Admitting: *Deleted

## 2022-05-21 DIAGNOSIS — Z20822 Contact with and (suspected) exposure to covid-19: Secondary | ICD-10-CM | POA: Diagnosis not present

## 2022-05-21 DIAGNOSIS — Z202 Contact with and (suspected) exposure to infections with a predominantly sexual mode of transmission: Secondary | ICD-10-CM

## 2022-05-21 DIAGNOSIS — B9689 Other specified bacterial agents as the cause of diseases classified elsewhere: Secondary | ICD-10-CM | POA: Diagnosis not present

## 2022-05-21 DIAGNOSIS — N200 Calculus of kidney: Secondary | ICD-10-CM

## 2022-05-21 DIAGNOSIS — N760 Acute vaginitis: Secondary | ICD-10-CM | POA: Diagnosis not present

## 2022-05-21 DIAGNOSIS — R1084 Generalized abdominal pain: Secondary | ICD-10-CM

## 2022-05-21 DIAGNOSIS — R109 Unspecified abdominal pain: Secondary | ICD-10-CM | POA: Diagnosis present

## 2022-05-21 DIAGNOSIS — E876 Hypokalemia: Secondary | ICD-10-CM | POA: Diagnosis not present

## 2022-05-21 LAB — WET PREP, GENITAL
Sperm: NONE SEEN
Trich, Wet Prep: NONE SEEN
WBC, Wet Prep HPF POC: >=10 — AB (ref <10)
Yeast Wet Prep HPF POC: NONE SEEN

## 2022-05-21 LAB — COMPREHENSIVE METABOLIC PANEL
ALT: 12 U/L (ref 0–44)
AST: 14 U/L — ABNORMAL LOW (ref 15–41)
Albumin: 3.7 g/dL (ref 3.5–5.0)
Alkaline Phosphatase: 73 U/L (ref 38–126)
Anion gap: 9 (ref 5–15)
BUN: 8 mg/dL (ref 6–20)
CO2: 18 mmol/L — ABNORMAL LOW (ref 22–32)
Calcium: 9.1 mg/dL (ref 8.9–10.3)
Chloride: 111 mmol/L (ref 98–111)
Creatinine, Ser: 0.7 mg/dL (ref 0.44–1.00)
GFR, Estimated: >60 mL/min (ref >60)
Glucose, Bld: 147 mg/dL — ABNORMAL HIGH (ref 70–99)
Potassium: 3.4 mmol/L — ABNORMAL LOW (ref 3.5–5.1)
Sodium: 138 mmol/L (ref 135–145)
Total Bilirubin: 0.4 mg/dL (ref 0.3–1.2)
Total Protein: 7 g/dL (ref 6.5–8.1)

## 2022-05-21 LAB — URINALYSIS, ROUTINE W REFLEX MICROSCOPIC
Bilirubin Urine: NEGATIVE
Glucose, UA: NEGATIVE mg/dL
Ketones, ur: NEGATIVE mg/dL
Nitrite: NEGATIVE
Protein, ur: 30 mg/dL — AB
Specific Gravity, Urine: 1.015 (ref 1.005–1.030)
WBC, UA: >50 WBC/hpf — ABNORMAL HIGH (ref 0–5)
pH: 6 (ref 5.0–8.0)

## 2022-05-21 LAB — CBC WITH DIFFERENTIAL/PLATELET
Abs Immature Granulocytes: 0 10*3/uL (ref 0.00–0.07)
Basophils Absolute: 0 10*3/uL (ref 0.0–0.1)
Basophils Relative: 0 %
Eosinophils Absolute: 0.1 10*3/uL (ref 0.0–0.5)
Eosinophils Relative: 1 %
HCT: 39.7 % (ref 36.0–46.0)
Hemoglobin: 13 g/dL (ref 12.0–15.0)
Lymphocytes Relative: 23 %
Lymphs Abs: 2 10*3/uL (ref 0.7–4.0)
MCH: 26.5 pg (ref 26.0–34.0)
MCHC: 32.7 g/dL (ref 30.0–36.0)
MCV: 81 fL (ref 80.0–100.0)
Monocytes Absolute: 0.4 10*3/uL (ref 0.1–1.0)
Monocytes Relative: 4 %
Neutro Abs: 6.3 10*3/uL (ref 1.7–7.7)
Neutrophils Relative %: 72 %
Platelets: 283 10*3/uL (ref 150–400)
RBC: 4.9 MIL/uL (ref 3.87–5.11)
RDW: 14.2 % (ref 11.5–15.5)
WBC: 8.8 10*3/uL (ref 4.0–10.5)
nRBC: 0 % (ref 0.0–0.2)
nRBC: 0 /100 WBC

## 2022-05-21 LAB — LIPASE, BLOOD: Lipase: 31 U/L (ref 11–51)

## 2022-05-21 LAB — RESP PANEL BY RT-PCR (FLU A&B, COVID) ARPGX2
Influenza A by PCR: NEGATIVE
Influenza B by PCR: NEGATIVE
SARS Coronavirus 2 by RT PCR: NEGATIVE

## 2022-05-21 LAB — RAPID URINE DRUG SCREEN, HOSP PERFORMED
Amphetamines: NOT DETECTED
Barbiturates: NOT DETECTED
Benzodiazepines: NOT DETECTED
Cocaine: POSITIVE — AB
Opiates: NOT DETECTED
Tetrahydrocannabinol: POSITIVE — AB

## 2022-05-21 LAB — I-STAT BETA HCG BLOOD, ED (MC, WL, AP ONLY): I-stat hCG, quantitative: <5 m[IU]/mL (ref <5)

## 2022-05-21 MED ORDER — IOHEXOL 350 MG/ML SOLN
75.0000 mL | Freq: Once | INTRAVENOUS | Status: AC | PRN
  Administered 2022-05-21: 75 mL via INTRAVENOUS

## 2022-05-21 MED ORDER — KETOROLAC TROMETHAMINE 60 MG/2ML IM SOLN
60.0000 mg | Freq: Once | INTRAMUSCULAR | Status: AC
  Administered 2022-05-21: 60 mg via INTRAMUSCULAR
  Filled 2022-05-21: qty 2

## 2022-05-21 MED ORDER — DOXYCYCLINE HYCLATE 100 MG PO TABS
100.0000 mg | ORAL_TABLET | Freq: Once | ORAL | Status: AC
  Administered 2022-05-21: 100 mg via ORAL
  Filled 2022-05-21: qty 1

## 2022-05-21 MED ORDER — METRONIDAZOLE 500 MG PO TABS
500.0000 mg | ORAL_TABLET | Freq: Once | ORAL | Status: AC
  Administered 2022-05-21: 500 mg via ORAL
  Filled 2022-05-21: qty 1

## 2022-05-21 MED ORDER — SODIUM CHLORIDE 0.9 % IV SOLN
1.0000 g | Freq: Once | INTRAVENOUS | Status: AC
  Administered 2022-05-21: 1 g via INTRAVENOUS
  Filled 2022-05-21: qty 10

## 2022-05-21 MED ORDER — DOXYCYCLINE HYCLATE 100 MG PO CAPS: 100.0000 mg | ORAL_CAPSULE | Freq: Two times a day (BID) | ORAL | 0 refills | Status: AC

## 2022-05-21 MED ORDER — METRONIDAZOLE 500 MG PO TABS: 500.0000 mg | ORAL_TABLET | Freq: Two times a day (BID) | ORAL | 0 refills | Status: DC

## 2022-05-21 NOTE — Telephone Encounter (Signed)
I called Kerry Lucas and she reports she has already gone to ED and found out she has a kidney stone. States they gave her pain medicine. They also did her STD testing and pregnancy test (negative). We discussed we do not treat kidney stones so if she continues to have issues she should go to her PCP, Urgent care or back to ED. I informed her I hope she feels better soon. She voices understanding.  Nancy Fetter

## 2022-05-21 NOTE — Discharge Instructions (Addendum)
You were evaluated in the Emergency Department and after careful evaluation, we did not find any emergent condition requiring admission or further testing in the hospital.  Your work-up today was overall reassuring.  Your CT scan showed that you have a kidney stone in the left kidney, this should not cause problems unless it starts moving down the ureter.  I do not think this is contributing to your pain today.  As discussed, we will treat you for gonorrhea and chlamydia today.  Your results today were suggestive of bacterial vaginosis.  This is not an STD but just an overgrowth of bacteria.  You got an injection of Rocephin for gonorrhea and you will need to take the rest of the course of the 2 different antibiotics for BV and chlamydia.  Your results will be available via the MyChart app in several days.  Please download the app, there are instructions on your discharge paperwork on how to download and set this up.  If your results are positive, our pharmacy will call you.  If the results are positive, please abstain from sex for 2 weeks, and have all partners tested and treated.  Please use condoms during intercourse.  Of note, do not drink alcohol with Flagyl as it will make you very sick with vomiting.  Your symptoms also could be from marijuana and cocaine use, please try to avoid these in the future.  Please return to the Emergency Department if you experience any worsening of your condition.  We encourage you to follow up with a primary care provider.  Thank you for allowing [place] to be a part of your care.

## 2022-05-21 NOTE — ED Provider Notes (Signed)
[facility] EMERGENCY DEPARTMENT Provider Note   CSN: [id number] Arrival date & time: 05/21/22  1638     History  Chief Complaint  Patient presents with   Abdominal Pain    Kerry Lucas is a 21 y.o. female.  HPI 21 year old female w/ a history of anxiety, headaches, UTI, depression, gonorrhea presents to ER with complaints of mid to upper abdominal pain which started 3 days ago, worsening today.  Patient stated that she is concerned that she may have had a miscarriage.  She had a period last month that came early and she had some thick heavy clots which she thought may be could have been a miscarriage.  She never took a pregnancy test.  She states that she is currently on her menstrual cycle, 3 days in.  She is sexually active.  She does report a history of gonorrhea.  She had 1 episode of emesis.  She is visibly intoxicated on arrival to the ER.  Home Medications Prior to Admission medications   Medication Sig Start Date End Date Taking? Authorizing Provider  doxycycline (VIBRAMYCIN) 100 MG capsule Take 1 capsule (100 mg total) by mouth 2 (two) times daily for 7 days. 05/21/22 05/28/22 Yes Garald Balding, PA-C  metroNIDAZOLE (FLAGYL) 500 MG tablet Take 1 tablet (500 mg total) by mouth 2 (two) times daily. 05/21/22  Yes Garald Balding, PA-C  fluconazole (DIFLUCAN) 100 MG tablet Take 1 tablet (100 mg total) by mouth daily. 01/03/22   Luvenia Redden, PA-C      Allergies    Patient has no known allergies.    Review of Systems   Review of Systems Ten systems reviewed and are negative for acute change, except as noted in the HPI.   Physical Exam Updated Vital Signs BP 98/63 (BP Location: Left Arm)   Pulse 64   Temp 98.7 F (37.1 C) (Oral)   Resp 17   SpO2 99%  Physical Exam Vitals and nursing note reviewed.  Constitutional:      General: She is not in acute distress.    Appearance: She is well-developed.  HENT:     Head: Normocephalic and atraumatic.   Eyes:     Conjunctiva/sclera: Conjunctivae normal.  Cardiovascular:     Rate and Rhythm: Normal rate and regular rhythm.     Heart sounds: No murmur heard. Pulmonary:     Effort: Pulmonary effort is normal. No respiratory distress.     Breath sounds: Normal breath sounds.  Abdominal:     Palpations: Abdomen is soft.     Tenderness: There is abdominal tenderness.     Comments: Generalized abdominal pain, tearful on exam, no CVA tenderness, no focal tenderness, no peritoneal signs, no guarding  Genitourinary:    Comments: Pelvic exam performed with RN at bedside.  Purulent green/white discharge in the vaginal vault.  No cervical motion tenderness.  No adnexal tenderness bilaterally. Musculoskeletal:        General: No swelling.     Cervical back: Neck supple.  Skin:    General: Skin is warm and dry.     Capillary Refill: Capillary refill takes less than 2 seconds.  Neurological:     Mental Status: She is alert.  Psychiatric:        Mood and Affect: Mood normal.     ED Results / Procedures / Treatments   Labs (all labs ordered are listed, but only abnormal results are displayed) Labs Reviewed  WET PREP, GENITAL -  Abnormal; Notable for the following components:      Result Value   Clue Cells Wet Prep HPF POC PRESENT (*)    WBC, Wet Prep HPF POC >=10 (*)    All other components within normal limits  COMPREHENSIVE METABOLIC PANEL - Abnormal; Notable for the following components:   Potassium 3.4 (*)    CO2 18 (*)    Glucose, Bld 147 (*)    AST 14 (*)    All other components within normal limits  URINALYSIS, ROUTINE W REFLEX MICROSCOPIC - Abnormal; Notable for the following components:   APPearance HAZY (*)    Hgb urine dipstick SMALL (*)    Protein, ur 30 (*)    Leukocytes,Ua LARGE (*)    WBC, UA >50 (*)    Bacteria, UA RARE (*)    All other components within normal limits  RAPID URINE DRUG SCREEN, HOSP PERFORMED - Abnormal; Notable for the following components:    Cocaine POSITIVE (*)    Tetrahydrocannabinol POSITIVE (*)    All other components within normal limits  RESP PANEL BY RT-PCR (FLU A&B, COVID) ARPGX2  CBC WITH DIFFERENTIAL/PLATELET  LIPASE, BLOOD  I-STAT BETA HCG BLOOD, ED ([facility], [facility], [facility] ONLY)  GC/CHLAMYDIA PROBE AMP ([place]) NOT AT [facility]    EKG None  Radiology CT ABDOMEN PELVIS W CONTRAST  Result Date: 05/21/2022 CLINICAL DATA:  Abdominal pain, acute, nonlocalized EXAM: CT ABDOMEN AND PELVIS WITH CONTRAST TECHNIQUE: Multidetector CT imaging of the abdomen and pelvis was performed using the standard protocol following bolus administration of intravenous contrast. RADIATION DOSE REDUCTION: This exam was performed according to the departmental dose-optimization program which includes automated exposure control, adjustment of the mA and/or kV according to patient size and/or use of iterative reconstruction technique. CONTRAST:  75mL OMNIPAQUE IOHEXOL 350 MG/ML SOLN COMPARISON:  CT 12/02/2020 FINDINGS: Lower chest: No acute abnormality. Hepatobiliary: No focal liver abnormality is seen. The gallbladder is unremarkable. Pancreas: Unremarkable. No pancreatic ductal dilatation or surrounding inflammatory changes. Spleen: Mild heterogeneity likely due to contrast timing. Normal size. Small adjacent splenule. Adrenals/Urinary Tract: Adrenal glands are unremarkable. No hydronephrosis. There is a punctate nonobstructive left lower pole renal stone. The bladder is moderately distended. Stomach/Bowel: The stomach is within normal limits. There is no evidence of bowel obstruction.The appendix is normal. Vascular/Lymphatic: No significant vascular findings are present. No enlarged abdominal or pelvic lymph nodes. Reproductive: Retroflexed uterus.  No adnexal mass. Other: No abdominal wall hernia or abnormality. No abdominopelvic ascites. Musculoskeletal: No acute osseous abnormality. No suspicious osseous lesion. Pseudoarticulation of the right transverse process  of L5 with the sacrum. IMPRESSION: No acute findings in the abdomen or pelvis. Punctate nonobstructive stone in the lower pole of the left kidney. Electronically Signed   By: Jacob  Kahn M.D.   On: 05/21/2022 09:51    Procedures Procedures    Medications Ordered in ED Medications  ketorolac (TORADOL) injection 60 mg (60 mg Intramuscular Given 05/21/22 0735)  iohexol (OMNIPAQUE) 350 MG/ML injection 75 mL (75 mLs Intravenous Contrast Given 05/21/22 0932)  cefTRIAXone (ROCEPHIN) 1 g in sodium chloride 0.9 % 100 mL IVPB (1 g Intravenous New Bag/Given 05/21/22 1023)  doxycycline (VIBRA-TABS) tablet 100 mg (100 mg Oral Given 05/21/22 1020)  metroNIDAZOLE (FLAGYL) tablet 500 mg (500 mg Oral Given 05/21/22 1026)    ED Course/ Medical Decision Making/ A&P                             Medical Decision Making Amount and/or Complexity of Data Reviewed Labs: ordered. Radiology: ordered.  Risk Prescription drug management.   21-year-old female presenting to the ER with complaints of abdominal pain.  Vitals on arrival overall reassuring.  She has generalized abdominal tenderness, is tearful, but no focal tenderness, no guarding, no peritoneal signs.  Differential diagnosis includes cholecystitis, appendicitis, SBO, IBS, constipation, PID, TOA, ovarian torsion, pancreatitis  Labs ordered, reviewed and interpreted by me CBC unremarkable CMP with mild hypokalemia, normal ALT, mildly decreased AST, normal creatinine, alk phos and total bili. UDS with small amount hemoglobin, large leukocytes, more than 50 WBCs, and rare bacteria UDS positive for cocaine and THC Negative COVID and flu Wet prep with clue cells, more than 10 WBCs Pregnancy is negative  CT of the abdomen ordered given significant generalized tenderness on exam Personally reviewed, agree with radiology read IMPRESSION:  No acute findings in the abdomen or pelvis.    Punctate nonobstructive stone in the lower pole of the left kidney.    Patient denies any urinary symptoms.  We will forego treatment at this time.  She does have some concerns for possible gonorrhea given her history, will treat for GC chlamydia and for BV.  Low suspicion for PID, TOA given no tenderness on exam.  Low suspicion for cholecystitis, SBO, appendicitis given negative CT scan.  Low suspicion for UTI/pyelonephritis given no symptoms, however she was also given Rocephin would which would treat this.  Patient was given Toradol for pain.  On reevaluation, notes significant improvement in pain.  I discussed that patient is possible drug use could be contributing to her symptoms.  Patient did not have any episodes of emesis and tolerated p.o. well, low suspicion for cannabis hyperemesis at this time.  Will DC with Flagyl and Doxy and educated on following up on her  GC chlamydia results via MyChart.  If this is positive, patient was educated to have all partners tested and treated and abstain from sex for 2 weeks.  She was educated on the left kidney stone.  Do not think this is contributing to her pain today.  Discussed return precautions.  She was understanding and is agreeable.  Stable for discharge.   Final Clinical Impression(s) / ED Diagnoses Final diagnoses:  Possible exposure to STD  Generalized abdominal pain  Kidney stone on left side  Bacterial vaginosis    Rx / DC Orders ED Discharge Orders          Ordered    doxycycline (VIBRAMYCIN) 100 MG capsule  2 times daily        05/21/22 1030    metroNIDAZOLE (FLAGYL) 500 MG tablet  2 times daily        05/21/22 1030              Belaya, Maria A, PA-C 05/21/22 1055    Branham, Victoria C, MD 05/21/22 1927  

## 2022-05-21 NOTE — ED Triage Notes (Signed)
BIB [facility] from home. C/o generalized abd pain over 3 days. Progressively worse. Per pt she has stated that it could possibly due to a possible miscarriage. Unsure if pregnant due to her not taking a home test, however last month her period started later than it normally does and she is currently on it now started 3 days ago but that is earlier than normal for her.

## 2022-05-21 NOTE — ED Notes (Signed)
C/o abd pain that is all over, that she can't lay on her stomach or her sides. She would like to be tested for STD's and a  yeast infection. She is concerned that she may have had a miscarriage

## 2022-05-22 LAB — GC/CHLAMYDIA PROBE AMP (~~LOC~~) NOT AT ARMC
Chlamydia: POSITIVE — AB
Comment: NEGATIVE
Comment: NORMAL
Neisseria Gonorrhea: POSITIVE — AB

## 2022-06-16 ENCOUNTER — Ambulatory Visit: Payer: Medicaid Other | Admitting: Family Medicine

## 2022-08-12 ENCOUNTER — Ambulatory Visit: Payer: Medicaid Other | Admitting: Obstetrics and Gynecology

## 2022-09-08 ENCOUNTER — Ambulatory Visit (HOSPITAL_COMMUNITY): Payer: Medicaid Other

## 2022-10-04 ENCOUNTER — Encounter (HOSPITAL_COMMUNITY): Payer: Self-pay | Admitting: *Deleted

## 2022-10-04 ENCOUNTER — Other Ambulatory Visit: Payer: Self-pay

## 2022-10-04 ENCOUNTER — Ambulatory Visit (HOSPITAL_COMMUNITY)
Admission: EM | Admit: 2022-10-04 | Discharge: 2022-10-04 | Disposition: A | Discharge status: Home / Self Care | Payer: Medicaid Other | Attending: Physician Assistant | Admitting: Physician Assistant

## 2022-10-04 DIAGNOSIS — A749 Chlamydial infection, unspecified: Secondary | ICD-10-CM | POA: Insufficient documentation

## 2022-10-04 DIAGNOSIS — Z202 Contact with and (suspected) exposure to infections with a predominantly sexual mode of transmission: Secondary | ICD-10-CM | POA: Diagnosis present

## 2022-10-04 LAB — POCT URINALYSIS DIPSTICK, ED / UC
Bilirubin Urine: NEGATIVE
Glucose, UA: NEGATIVE mg/dL
Hgb urine dipstick: NEGATIVE
Ketones, ur: NEGATIVE mg/dL
Nitrite: NEGATIVE
Protein, ur: 30 mg/dL — AB
Specific Gravity, Urine: 1.025 (ref 1.005–1.030)
Urobilinogen, UA: 0.2 mg/dL (ref 0.0–1.0)
pH: 6.5 (ref 5.0–8.0)

## 2022-10-04 LAB — POC URINE PREG, ED: Preg Test, Ur: NEGATIVE

## 2022-10-04 MED ORDER — DOXYCYCLINE HYCLATE 100 MG PO CAPS: 100.0000 mg | ORAL_CAPSULE | Freq: Two times a day (BID) | ORAL | 0 refills | Status: AC

## 2022-10-04 NOTE — Discharge Instructions (Signed)
Take medication as prescribed Will call with test results and change treatment plan if indicated Follow up with [facility] or [facility] for contraception management

## 2022-10-04 NOTE — ED Provider Notes (Signed)
[facility]    CSN: [id number] Arrival date & time: 10/04/22  1113      History   Chief Complaint Chief Complaint  Patient presents with   Vaginal Discharge   Vaginal Itching   Exposure to STD    HPI Kerry Lucas is a 22 y.o. female.   Patient presents with increased vaginal discharge that started several days ago.  She reports a recent partner tested positive for chlamydia.  She is here for testing and treatment today.  She is also requesting a pregnancy test.  She reports in December she had an abnormal period, reports intermittent spotting after her normal period.  She is not currently on any contraception and reports she missed her appointment for her Depo shot.  She denies abdominal pain fever chills pelvic pain.    Past Medical History:  Diagnosis Date   Anxiety    Chlamydia    Depression    denies issues   Gonorrhea    Headache    Trichomonas infection    UTI (urinary tract infection)     Patient Active Problem List   Diagnosis Date Noted   Amniotic fluid leaking 10/08/2021   Indication for care in labor or delivery 10/08/2021   Intrauterine pregnancy in teenager 10/08/2021   Supervision of other normal pregnancy, antepartum 10/06/2021   Trichomonas vaginalis infection 06/21/2021   Fetal echogenic intracardiac focus on prenatal ultrasound 06/20/2021   History of anxiety 04/17/2021   History of vacuum extraction assisted delivery 04/17/2021   History of gonorrhea 04/17/2021   Hidradenitis suppurativa 04/17/2021   Bacterial vaginosis in pregnancy 04/17/2021   Marijuana use 03/27/2021   Acute cystitis without hematuria 06/14/2020   Abdominal pain, chronic, right lower quadrant 06/11/2020   Routine screening for STI (sexually transmitted infection) 06/11/2020   Vaginal discharge 06/11/2020   Dysuria 06/11/2020   Right inguinal hernia 07/16/2016    Past Surgical History:  Procedure Laterality Date   HERNIA REPAIR Right 2017    OB History      Gravida  2   Para  2   Term  2   Preterm      AB      Living  2      SAB      IAB      Ectopic      Multiple  0   Live Births  2            Home Medications    Prior to Admission medications   Medication Sig Start Date End Date Taking? Authorizing Provider  doxycycline (VIBRAMYCIN) 100 MG capsule Take 1 capsule (100 mg total) by mouth 2 (two) times daily for 7 days. 10/04/22 10/11/22 Yes Ward, Lenise Arena, PA-C  fluconazole (DIFLUCAN) 100 MG tablet Take 1 tablet (100 mg total) by mouth daily. 01/03/22   Luvenia Redden, PA-C  metroNIDAZOLE (FLAGYL) 500 MG tablet Take 1 tablet (500 mg total) by mouth 2 (two) times daily. 05/21/22   Garald Balding, PA-C    Family History Family History  Problem Relation Age of Onset   Healthy Mother    Hypertension Father    Diabetes Father     Social History Social History   Tobacco Use   Smoking status: Former    Types: Cigarettes   Smokeless tobacco: Never   Tobacco comments:    Stopped cigarettes in June 2022  Vaping Use   Vaping Use: Never used  Substance Use Topics   Alcohol use:  Never   Drug use: Not Currently    Types: Marijuana     Allergies   Patient has no known allergies.   Review of Systems Review of Systems  Constitutional:  Negative for chills and fever.  HENT:  Negative for ear pain and sore throat.   Eyes:  Negative for pain and visual disturbance.  Respiratory:  Negative for cough and shortness of breath.   Cardiovascular:  Negative for chest pain and palpitations.  Gastrointestinal:  Negative for abdominal pain and vomiting.  Genitourinary:  Positive for vaginal discharge. Negative for dysuria and hematuria.  Musculoskeletal:  Negative for arthralgias and back pain.  Skin:  Negative for color change and rash.  Neurological:  Negative for seizures and syncope.  All other systems reviewed and are negative.    Physical Exam Triage Vital Signs ED Triage Vitals  Enc Vitals Group      BP 10/04/22 1311 108/73     Pulse Rate 10/04/22 1311 84     Resp 10/04/22 1311 18     Temp 10/04/22 1311 97.6 F (36.4 C)     Temp src --      SpO2 10/04/22 1311 98 %     Weight --      Height --      Head Circumference --      Peak Flow --      Pain Score 10/04/22 1307 0     Pain Loc --      Pain Edu? --      Excl. in [place]? --    No data found.  Updated Vital Signs BP 108/73   Pulse 84   Temp 97.6 F (36.4 C)   Resp 18   LMP 08/08/2022   SpO2 98%   Visual Acuity Right Eye Distance:   Left Eye Distance:   Bilateral Distance:    Right Eye Near:   Left Eye Near:    Bilateral Near:     Physical Exam Vitals and nursing note reviewed.  Constitutional:      General: She is not in acute distress.    Appearance: She is well-developed.  HENT:     Head: Normocephalic and atraumatic.  Eyes:     Conjunctiva/sclera: Conjunctivae normal.  Cardiovascular:     Rate and Rhythm: Normal rate and regular rhythm.     Heart sounds: No murmur heard. Pulmonary:     Effort: Pulmonary effort is normal. No respiratory distress.     Breath sounds: Normal breath sounds.  Abdominal:     Palpations: Abdomen is soft.     Tenderness: There is no abdominal tenderness.  Musculoskeletal:        General: No swelling.     Cervical back: Neck supple.  Skin:    General: Skin is warm and dry.     Capillary Refill: Capillary refill takes less than 2 seconds.  Neurological:     Mental Status: She is alert.  Psychiatric:        Mood and Affect: Mood normal.      UC Treatments / Results  Labs (all labs ordered are listed, but only abnormal results are displayed) Labs Reviewed  POCT URINALYSIS DIPSTICK, ED / UC - Abnormal; Notable for the following components:      Result Value   Protein, ur 30 (*)    Leukocytes,Ua SMALL (*)    All other components within normal limits  POC URINE PREG, ED  CERVICOVAGINAL ANCILLARY ONLY    EKG  Radiology No results  found.  Procedures Procedures (including critical care time)  Medications Ordered in UC Medications - No data to display  Initial Impression / Assessment and Plan / UC Course  I have reviewed the triage vital signs and the nursing notes.  Pertinent labs & imaging results that were available during my care of the patient were reviewed by me and considered in my medical decision making (see chart for details).     Will treat for chlamydia today given recent positive exposure.  No vaginal self swab in clinic today, will change treatment plan if indicated based on results.  Negative pregnancy test in clinic today discussed contraception methods including barrier method.  Advised to follow-up with [facility] or [facility] Final Clinical Impressions(s) / UC Diagnoses   Final diagnoses:  Chlamydia  STD exposure     Discharge Instructions      Take medication as prescribed Will call with test results and change treatment plan if indicated Follow up with [facility] or [facility] for contraception management    ED Prescriptions     Medication Sig Dispense Auth. Provider   doxycycline (VIBRAMYCIN) 100 MG capsule Take 1 capsule (100 mg total) by mouth 2 (two) times daily for 7 days. 14 capsule Ward, Tylene Fantasia, PA-C      PDMP not reviewed this encounter.   Ward, Tylene Fantasia, PA-C 10/04/22 1356

## 2022-10-04 NOTE — ED Triage Notes (Signed)
PT now reports a possible miscarriage in [place].

## 2022-10-04 NOTE — ED Triage Notes (Signed)
Pt reports having  a milky Vag discharge  and Vag itching. Pt also wants to have STD testing.

## 2022-10-06 ENCOUNTER — Telehealth (HOSPITAL_COMMUNITY): Payer: Self-pay | Admitting: Emergency Medicine

## 2022-10-06 LAB — CERVICOVAGINAL ANCILLARY ONLY
Bacterial Vaginitis (gardnerella): POSITIVE — AB
Candida Glabrata: NEGATIVE
Candida Vaginitis: NEGATIVE
Chlamydia: NEGATIVE
Comment: NEGATIVE
Comment: NEGATIVE
Comment: NEGATIVE
Comment: NEGATIVE
Comment: NEGATIVE
Comment: NORMAL
Neisseria Gonorrhea: NEGATIVE
Trichomonas: POSITIVE — AB

## 2022-10-06 MED ORDER — METRONIDAZOLE 500 MG PO TABS: 500.0000 mg | ORAL_TABLET | Freq: Two times a day (BID) | ORAL | 0 refills | Status: AC

## 2022-10-06 NOTE — Telephone Encounter (Signed)
Patient left a voicemail over the weekend concerned because she saw a positive test result.  However, it appears those positive results are from September of 2023.  We are still waiting on the results of her cyto swab from the weekend.  Attempted to return patient's voicemail, her voicemail is full.

## 2022-10-13 ENCOUNTER — Telehealth: Payer: Medicaid Other | Admitting: Family Medicine

## 2022-10-13 DIAGNOSIS — N898 Other specified noninflammatory disorders of vagina: Secondary | ICD-10-CM

## 2022-10-13 NOTE — Progress Notes (Signed)
Because of the concern for new symptoms- while on treatment, please go to the nearest urgent care for testing and to talk to someone in person about anything going on, I feel your condition warrants further evaluation and I recommend that you be seen in a face to face visit.

## 2022-10-15 ENCOUNTER — Emergency Department (HOSPITAL_COMMUNITY): Payer: Medicaid Other

## 2022-10-15 ENCOUNTER — Other Ambulatory Visit: Payer: Self-pay

## 2022-10-15 ENCOUNTER — Emergency Department (HOSPITAL_COMMUNITY)
Admission: EM | Admit: 2022-10-15 | Discharge: 2022-10-15 | Disposition: A | Discharge status: Home / Self Care | Payer: Medicaid Other | Attending: Emergency Medicine | Admitting: Emergency Medicine

## 2022-10-15 DIAGNOSIS — S0181XA Laceration without foreign body of other part of head, initial encounter: Secondary | ICD-10-CM | POA: Diagnosis not present

## 2022-10-15 DIAGNOSIS — Z23 Encounter for immunization: Secondary | ICD-10-CM | POA: Insufficient documentation

## 2022-10-15 DIAGNOSIS — Y92009 Unspecified place in unspecified non-institutional (private) residence as the place of occurrence of the external cause: Secondary | ICD-10-CM | POA: Insufficient documentation

## 2022-10-15 DIAGNOSIS — S0990XA Unspecified injury of head, initial encounter: Secondary | ICD-10-CM | POA: Diagnosis present

## 2022-10-15 DIAGNOSIS — Y9384 Activity, sleeping: Secondary | ICD-10-CM | POA: Insufficient documentation

## 2022-10-15 MED ORDER — FENTANYL CITRATE PF 50 MCG/ML IJ SOSY
25.0000 ug | PREFILLED_SYRINGE | Freq: Once | INTRAMUSCULAR | Status: AC
  Administered 2022-10-15: 25 ug via INTRAVENOUS
  Filled 2022-10-15: qty 1

## 2022-10-15 MED ORDER — TETANUS-DIPHTH-ACELL PERTUSSIS 5-2.5-18.5 LF-MCG/0.5 IM SUSY
0.5000 mL | PREFILLED_SYRINGE | Freq: Once | INTRAMUSCULAR | Status: AC
  Administered 2022-10-15: 0.5 mL via INTRAMUSCULAR
  Filled 2022-10-15: qty 0.5

## 2022-10-15 MED ORDER — LIDOCAINE HCL 2 % IJ SOLN
10.0000 mL | Freq: Once | INTRAMUSCULAR | Status: AC
  Administered 2022-10-15: 200 mg via INTRADERMAL
  Filled 2022-10-15: qty 20

## 2022-10-15 NOTE — ED Notes (Signed)
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
## 2022-10-15 NOTE — ED Notes (Signed)
DC instructions reviewed with pt. PT verbalized understanding.  PT DC. 

## 2023-07-20 ENCOUNTER — Ambulatory Visit
Admission: EM | Admit: 2023-07-20 | Discharge: 2023-07-20 | Disposition: A | Discharge status: Home / Self Care | Payer: Medicaid Other | Attending: Internal Medicine | Admitting: Internal Medicine

## 2023-07-20 DIAGNOSIS — L02416 Cutaneous abscess of left lower limb: Secondary | ICD-10-CM | POA: Diagnosis not present

## 2023-07-20 MED ORDER — CEPHALEXIN 500 MG PO CAPS: 500.0000 mg | ORAL_CAPSULE | Freq: Four times a day (QID) | ORAL | 0 refills | Status: AC

## 2023-07-20 NOTE — Discharge Instructions (Signed)
I have prescribed an antibiotic for your abscess.  Use warm compresses as we discussed.  Follow-up if it persists or worsens.  I did provide you with contact information for urgent care that tailor's to anxiety/depression.  Please follow-up with them for further evaluation.

## 2023-07-20 NOTE — ED Triage Notes (Signed)
Pt reports she started to develop an abscess on her left buttocks x 3 days  States they are recurring
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
## 2024-04-11 ENCOUNTER — Encounter: Admitting: Family

## 2024-04-11 NOTE — Progress Notes (Signed)
 Erroneous encounter-disregard
